# Patient Record
Sex: Female | Born: 1985 | Race: Black or African American | Hispanic: No | Marital: Single | Smoking: Current every day smoker
Health system: Southern US, Community
[De-identification: ages and names within clinical notes are randomized; demographics above are authoritative.]

## PROBLEM LIST (undated history)

## (undated) DIAGNOSIS — F909 Attention-deficit hyperactivity disorder, unspecified type: Secondary | ICD-10-CM

## (undated) DIAGNOSIS — I10 Essential (primary) hypertension: Secondary | ICD-10-CM

---

## 2014-01-07 DIAGNOSIS — F909 Attention-deficit hyperactivity disorder, unspecified type: Secondary | ICD-10-CM | POA: Insufficient documentation

## 2014-01-07 DIAGNOSIS — Z720 Tobacco use: Secondary | ICD-10-CM | POA: Insufficient documentation

## 2015-03-25 DIAGNOSIS — I8390 Asymptomatic varicose veins of unspecified lower extremity: Secondary | ICD-10-CM | POA: Insufficient documentation

## 2015-09-29 DIAGNOSIS — Z0000 Encounter for general adult medical examination without abnormal findings: Secondary | ICD-10-CM | POA: Insufficient documentation

## 2015-09-29 DIAGNOSIS — F3341 Major depressive disorder, recurrent, in partial remission: Secondary | ICD-10-CM | POA: Insufficient documentation

## 2016-04-09 DIAGNOSIS — R21 Rash and other nonspecific skin eruption: Secondary | ICD-10-CM | POA: Insufficient documentation

## 2016-07-05 DIAGNOSIS — M79632 Pain in left forearm: Secondary | ICD-10-CM | POA: Insufficient documentation

## 2016-07-05 DIAGNOSIS — M25532 Pain in left wrist: Secondary | ICD-10-CM | POA: Insufficient documentation

## 2016-07-05 DIAGNOSIS — T7491XA Unspecified adult maltreatment, confirmed, initial encounter: Secondary | ICD-10-CM | POA: Insufficient documentation

## 2016-07-11 DIAGNOSIS — F1210 Cannabis abuse, uncomplicated: Secondary | ICD-10-CM | POA: Insufficient documentation

## 2016-07-12 DIAGNOSIS — F1410 Cocaine abuse, uncomplicated: Secondary | ICD-10-CM | POA: Insufficient documentation

## 2017-04-12 ENCOUNTER — Emergency Department
Admission: EM | Admit: 2017-04-12 | Discharge: 2017-04-12 | Disposition: A | Discharge status: Home / Self Care | Payer: Medicaid Other | Attending: Emergency Medicine | Admitting: Emergency Medicine

## 2017-04-12 ENCOUNTER — Encounter: Payer: Self-pay | Admitting: Emergency Medicine

## 2017-04-12 ENCOUNTER — Emergency Department: Payer: Medicaid Other

## 2017-04-12 DIAGNOSIS — F17200 Nicotine dependence, unspecified, uncomplicated: Secondary | ICD-10-CM | POA: Insufficient documentation

## 2017-04-12 DIAGNOSIS — Y929 Unspecified place or not applicable: Secondary | ICD-10-CM | POA: Insufficient documentation

## 2017-04-12 DIAGNOSIS — R079 Chest pain, unspecified: Secondary | ICD-10-CM | POA: Insufficient documentation

## 2017-04-12 DIAGNOSIS — Y999 Unspecified external cause status: Secondary | ICD-10-CM | POA: Diagnosis not present

## 2017-04-12 DIAGNOSIS — Y939 Activity, unspecified: Secondary | ICD-10-CM | POA: Diagnosis not present

## 2017-04-12 DIAGNOSIS — R112 Nausea with vomiting, unspecified: Secondary | ICD-10-CM | POA: Diagnosis not present

## 2017-04-12 DIAGNOSIS — S299XXA Unspecified injury of thorax, initial encounter: Secondary | ICD-10-CM | POA: Diagnosis present

## 2017-04-12 LAB — BASIC METABOLIC PANEL
ANION GAP: 11 (ref 5–15)
BUN: 9 mg/dL (ref 6–20)
CALCIUM: 9.2 mg/dL (ref 8.9–10.3)
CO2: 25 mmol/L (ref 22–32)
Chloride: 102 mmol/L (ref 101–111)
Creatinine, Ser: 0.71 mg/dL (ref 0.44–1.00)
Glucose, Bld: 81 mg/dL (ref 65–99)
Potassium: 3.5 mmol/L (ref 3.5–5.1)
Sodium: 138 mmol/L (ref 135–145)

## 2017-04-12 LAB — CBC
HCT: 40.8 % (ref 35.0–47.0)
HEMOGLOBIN: 13.6 g/dL (ref 12.0–16.0)
MCH: 30.1 pg (ref 26.0–34.0)
MCHC: 33.4 g/dL (ref 32.0–36.0)
MCV: 90 fL (ref 80.0–100.0)
Platelets: 261 10*3/uL (ref 150–440)
RBC: 4.53 MIL/uL (ref 3.80–5.20)
RDW: 13.2 % (ref 11.5–14.5)
WBC: 8.5 10*3/uL (ref 3.6–11.0)

## 2017-04-12 LAB — TROPONIN I

## 2017-04-12 MED ORDER — IBUPROFEN 800 MG PO TABS
ORAL_TABLET | ORAL | Status: AC
  Administered 2017-04-12: 800 mg via ORAL
  Filled 2017-04-12: qty 1

## 2017-04-12 MED ORDER — ACETAMINOPHEN 500 MG PO TABS
1000.0000 mg | ORAL_TABLET | Freq: Once | ORAL | Status: AC
  Administered 2017-04-12: 1000 mg via ORAL

## 2017-04-12 MED ORDER — IBUPROFEN 800 MG PO TABS: 800.0000 mg | ORAL_TABLET | Freq: Three times a day (TID) | ORAL | 0 refills | Status: DC | PRN

## 2017-04-12 MED ORDER — ONDANSETRON 4 MG PO TBDP
ORAL_TABLET | ORAL | Status: AC
  Administered 2017-04-12: 4 mg via ORAL
  Filled 2017-04-12: qty 1

## 2017-04-12 MED ORDER — ACETAMINOPHEN 500 MG PO TABS
ORAL_TABLET | ORAL | Status: AC
  Administered 2017-04-12: 1000 mg via ORAL
  Filled 2017-04-12: qty 2

## 2017-04-12 MED ORDER — IBUPROFEN 800 MG PO TABS
800.0000 mg | ORAL_TABLET | Freq: Once | ORAL | Status: AC
Start: 2017-04-12 — End: 2017-04-12
  Administered 2017-04-12: 800 mg via ORAL

## 2017-04-12 MED ORDER — ONDANSETRON 4 MG PO TBDP
4.0000 mg | ORAL_TABLET | Freq: Once | ORAL | Status: AC
  Administered 2017-04-12: 4 mg via ORAL

## 2017-04-12 MED ORDER — TRAMADOL HCL 50 MG PO TABS: 50.0000 mg | ORAL_TABLET | Freq: Four times a day (QID) | ORAL | 0 refills | Status: AC | PRN
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
## 2017-06-24 ENCOUNTER — Emergency Department
Admission: EM | Admit: 2017-06-24 | Discharge: 2017-06-24 | Disposition: A | Discharge status: Home / Self Care | Payer: Medicaid Other | Attending: Emergency Medicine | Admitting: Emergency Medicine

## 2017-06-24 ENCOUNTER — Encounter: Payer: Self-pay | Admitting: Emergency Medicine

## 2017-06-24 DIAGNOSIS — R1110 Vomiting, unspecified: Secondary | ICD-10-CM | POA: Insufficient documentation

## 2017-06-24 DIAGNOSIS — F17200 Nicotine dependence, unspecified, uncomplicated: Secondary | ICD-10-CM | POA: Diagnosis not present

## 2017-06-24 DIAGNOSIS — J029 Acute pharyngitis, unspecified: Secondary | ICD-10-CM | POA: Diagnosis not present

## 2017-06-24 DIAGNOSIS — R070 Pain in throat: Secondary | ICD-10-CM | POA: Diagnosis present

## 2017-06-24 LAB — COMPREHENSIVE METABOLIC PANEL
ALBUMIN: 4.1 g/dL (ref 3.5–5.0)
ALT: 38 U/L (ref 14–54)
ANION GAP: 11 (ref 5–15)
AST: 64 U/L — ABNORMAL HIGH (ref 15–41)
Alkaline Phosphatase: 62 U/L (ref 38–126)
BILIRUBIN TOTAL: 1.4 mg/dL — AB (ref 0.3–1.2)
BUN: 7 mg/dL (ref 6–20)
CHLORIDE: 97 mmol/L — AB (ref 101–111)
CO2: 26 mmol/L (ref 22–32)
Calcium: 9 mg/dL (ref 8.9–10.3)
Creatinine, Ser: 0.77 mg/dL (ref 0.44–1.00)
GFR calc Af Amer: >60 mL/min (ref >60)
GFR calc non Af Amer: >60 mL/min (ref >60)
GLUCOSE: 108 mg/dL — AB (ref 65–99)
POTASSIUM: 3.3 mmol/L — AB (ref 3.5–5.1)
SODIUM: 134 mmol/L — AB (ref 135–145)
TOTAL PROTEIN: 7.6 g/dL (ref 6.5–8.1)

## 2017-06-24 LAB — URINALYSIS, COMPLETE (UACMP) WITH MICROSCOPIC
BACTERIA UA: NONE SEEN
BILIRUBIN URINE: NEGATIVE
Glucose, UA: NEGATIVE mg/dL
Ketones, ur: 20 mg/dL — AB
LEUKOCYTES UA: NEGATIVE
NITRITE: NEGATIVE
PROTEIN: 30 mg/dL — AB
Specific Gravity, Urine: 1.019 (ref 1.005–1.030)
pH: 6 (ref 5.0–8.0)

## 2017-06-24 LAB — LIPASE, BLOOD: LIPASE: 20 U/L (ref 11–51)

## 2017-06-24 LAB — CBC
HEMATOCRIT: 42.5 % (ref 35.0–47.0)
Hemoglobin: 14.1 g/dL (ref 12.0–16.0)
MCH: 29.7 pg (ref 26.0–34.0)
MCHC: 33.1 g/dL (ref 32.0–36.0)
MCV: 89.7 fL (ref 80.0–100.0)
Platelets: 227 10*3/uL (ref 150–440)
RBC: 4.74 MIL/uL (ref 3.80–5.20)
RDW: 12.7 % (ref 11.5–14.5)
WBC: 19.7 10*3/uL — ABNORMAL HIGH (ref 3.6–11.0)

## 2017-06-24 LAB — LACTIC ACID, PLASMA: Lactic Acid, Venous: 1.3 mmol/L (ref 0.5–1.9)

## 2017-06-24 LAB — POCT PREGNANCY, URINE: PREG TEST UR: NEGATIVE

## 2017-06-24 LAB — POCT RAPID STREP A: Streptococcus, Group A Screen (Direct): NEGATIVE

## 2017-06-24 MED ORDER — DEXAMETHASONE SODIUM PHOSPHATE 10 MG/ML IJ SOLN
10.0000 mg | Freq: Once | INTRAMUSCULAR | Status: AC
  Administered 2017-06-24: 10 mg via INTRAVENOUS
  Filled 2017-06-24: qty 1

## 2017-06-24 MED ORDER — KETOROLAC TROMETHAMINE 30 MG/ML IJ SOLN
30.0000 mg | Freq: Once | INTRAMUSCULAR | Status: AC
  Administered 2017-06-24: 30 mg via INTRAVENOUS

## 2017-06-24 MED ORDER — ONDANSETRON 4 MG PO TBDP
ORAL_TABLET | ORAL | Status: AC
  Filled 2017-06-24: qty 1

## 2017-06-24 MED ORDER — KETOROLAC TROMETHAMINE 30 MG/ML IJ SOLN
INTRAMUSCULAR | Status: AC
  Administered 2017-06-24: 30 mg via INTRAVENOUS
  Filled 2017-06-24: qty 1

## 2017-06-24 MED ORDER — ONDANSETRON 4 MG PO TBDP
4.0000 mg | ORAL_TABLET | Freq: Once | ORAL | Status: AC | PRN
  Administered 2017-06-24: 4 mg via ORAL

## 2017-06-24 MED ORDER — SODIUM CHLORIDE 0.9 % IV BOLUS (SEPSIS)
1000.0000 mL | Freq: Once | INTRAVENOUS | Status: AC
  Administered 2017-06-24: 1000 mL via INTRAVENOUS

## 2017-06-24 NOTE — Discharge Instructions (Signed)
Please seek medical attention for any high fevers, chest pain, shortness of breath, change in behavior, persistent vomiting, bloody stool or any other new or concerning symptoms.  

## 2017-06-24 NOTE — ED Notes (Signed)
Tech notified RN pt threw up in bathroom. Will give zofran
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
## 2017-06-24 NOTE — ED Notes (Signed)
EDP at bedside  

## 2017-06-27 LAB — CULTURE, GROUP A STREP (THRC)

## 2017-10-18 ENCOUNTER — Emergency Department: Payer: Medicaid Other

## 2017-10-18 ENCOUNTER — Emergency Department
Admission: EM | Admit: 2017-10-18 | Discharge: 2017-10-18 | Disposition: A | Discharge status: Home / Self Care | Payer: Medicaid Other | Attending: Emergency Medicine | Admitting: Emergency Medicine

## 2017-10-18 DIAGNOSIS — F17200 Nicotine dependence, unspecified, uncomplicated: Secondary | ICD-10-CM | POA: Diagnosis not present

## 2017-10-18 DIAGNOSIS — K529 Noninfective gastroenteritis and colitis, unspecified: Secondary | ICD-10-CM | POA: Diagnosis not present

## 2017-10-18 DIAGNOSIS — Z79899 Other long term (current) drug therapy: Secondary | ICD-10-CM | POA: Insufficient documentation

## 2017-10-18 DIAGNOSIS — R109 Unspecified abdominal pain: Secondary | ICD-10-CM | POA: Diagnosis present

## 2017-10-18 LAB — CBC
HCT: 40.4 % (ref 35.0–47.0)
Hemoglobin: 13.2 g/dL (ref 12.0–16.0)
MCH: 29.5 pg (ref 26.0–34.0)
MCHC: 32.7 g/dL (ref 32.0–36.0)
MCV: 90.3 fL (ref 80.0–100.0)
PLATELETS: 298 10*3/uL (ref 150–440)
RBC: 4.47 MIL/uL (ref 3.80–5.20)
RDW: 12.6 % (ref 11.5–14.5)
WBC: 24.8 10*3/uL — AB (ref 3.6–11.0)

## 2017-10-18 LAB — COMPREHENSIVE METABOLIC PANEL
ALK PHOS: 65 U/L (ref 38–126)
ALT: 15 U/L (ref 14–54)
AST: 22 U/L (ref 15–41)
Albumin: 3.8 g/dL (ref 3.5–5.0)
Anion gap: 10 (ref 5–15)
BUN: 9 mg/dL (ref 6–20)
CALCIUM: 9.3 mg/dL (ref 8.9–10.3)
CO2: 26 mmol/L (ref 22–32)
CREATININE: 0.7 mg/dL (ref 0.44–1.00)
Chloride: 98 mmol/L — ABNORMAL LOW (ref 101–111)
Glucose, Bld: 109 mg/dL — ABNORMAL HIGH (ref 65–99)
Potassium: 3.6 mmol/L (ref 3.5–5.1)
Sodium: 134 mmol/L — ABNORMAL LOW (ref 135–145)
Total Bilirubin: 0.6 mg/dL (ref 0.3–1.2)
Total Protein: 7.3 g/dL (ref 6.5–8.1)

## 2017-10-18 LAB — URINALYSIS, COMPLETE (UACMP) WITH MICROSCOPIC
Bacteria, UA: NONE SEEN
Bilirubin Urine: NEGATIVE
GLUCOSE, UA: NEGATIVE mg/dL
KETONES UR: 20 mg/dL — AB
Nitrite: NEGATIVE
PH: 5 (ref 5.0–8.0)
Protein, ur: 100 mg/dL — AB
Specific Gravity, Urine: 1.032 — ABNORMAL HIGH (ref 1.005–1.030)

## 2017-10-18 LAB — POCT PREGNANCY, URINE: Preg Test, Ur: NEGATIVE

## 2017-10-18 LAB — LACTIC ACID, PLASMA: Lactic Acid, Venous: 0.9 mmol/L (ref 0.5–1.9)

## 2017-10-18 LAB — LIPASE, BLOOD: Lipase: 18 U/L (ref 11–51)

## 2017-10-18 LAB — TROPONIN I: Troponin I: <0.03 ng/mL (ref <0.03)

## 2017-10-18 MED ORDER — MORPHINE SULFATE (PF) 4 MG/ML IV SOLN
INTRAVENOUS | Status: AC
  Administered 2017-10-18: 4 mg via INTRAVENOUS
  Filled 2017-10-18: qty 1

## 2017-10-18 MED ORDER — ONDANSETRON HCL 4 MG/2ML IJ SOLN
4.0000 mg | Freq: Once | INTRAMUSCULAR | Status: AC
  Administered 2017-10-18: 4 mg via INTRAVENOUS

## 2017-10-18 MED ORDER — ONDANSETRON HCL 4 MG/2ML IJ SOLN
INTRAMUSCULAR | Status: AC
  Administered 2017-10-18: 4 mg via INTRAVENOUS
  Filled 2017-10-18: qty 2

## 2017-10-18 MED ORDER — METRONIDAZOLE IN NACL 5-0.79 MG/ML-% IV SOLN
500.0000 mg | Freq: Once | INTRAVENOUS | Status: AC
  Administered 2017-10-18: 500 mg via INTRAVENOUS
  Filled 2017-10-18: qty 100

## 2017-10-18 MED ORDER — MORPHINE SULFATE (PF) 4 MG/ML IV SOLN
4.0000 mg | Freq: Once | INTRAVENOUS | Status: AC
  Administered 2017-10-18: 4 mg via INTRAVENOUS
  Filled 2017-10-18: qty 1

## 2017-10-18 MED ORDER — CEFTRIAXONE SODIUM IN DEXTROSE 20 MG/ML IV SOLN
1.0000 g | Freq: Once | INTRAVENOUS | Status: AC
  Administered 2017-10-18: 1 g via INTRAVENOUS
  Filled 2017-10-18: qty 50

## 2017-10-18 MED ORDER — CIPROFLOXACIN HCL 500 MG PO TABS: 500.0000 mg | ORAL_TABLET | Freq: Two times a day (BID) | ORAL | 0 refills | Status: AC

## 2017-10-18 MED ORDER — MORPHINE SULFATE (PF) 4 MG/ML IV SOLN
6.0000 mg | Freq: Once | INTRAVENOUS | Status: AC
  Administered 2017-10-18: 4 mg via INTRAVENOUS

## 2017-10-18 MED ORDER — SODIUM CHLORIDE 0.9 % IV BOLUS (SEPSIS)
1000.0000 mL | Freq: Once | INTRAVENOUS | Status: AC
  Administered 2017-10-18: 1000 mL via INTRAVENOUS

## 2017-10-18 MED ORDER — OXYCODONE-ACETAMINOPHEN 5-325 MG PO TABS: 1.0000 | ORAL_TABLET | Freq: Four times a day (QID) | ORAL | 0 refills | Status: DC | PRN

## 2017-10-18 MED ORDER — IOPAMIDOL (ISOVUE-300) INJECTION 61%
100.0000 mL | Freq: Once | INTRAVENOUS | Status: AC | PRN
Start: 2017-10-18 — End: 2017-10-18
  Administered 2017-10-18: 100 mL via INTRAVENOUS

## 2017-10-18 MED ORDER — KETOROLAC TROMETHAMINE 30 MG/ML IJ SOLN
15.0000 mg | Freq: Once | INTRAMUSCULAR | Status: AC
  Administered 2017-10-18: 15 mg via INTRAVENOUS
  Filled 2017-10-18: qty 1

## 2017-10-18 NOTE — ED Triage Notes (Signed)
Patient reports she donated plasma yesterday.

## 2017-10-18 NOTE — ED Notes (Signed)
Patient reports that her period started 9/28, lasted 2 weeks, stopped for 3 days, restarted and has been ongoing since.

## 2017-10-18 NOTE — ED Provider Notes (Signed)
[facility] Emergency Department Provider Note  ____________________________________________   First MD Initiated Contact with Patient 10/18/17 [phone]     (approximate)  I have reviewed the triage vital signs and the nursing notes.   HISTORY  Chief Complaint Abdominal Pain   HPI Gina Rich is a 31 y.o. female who self presents to the emergency department with roughly 48 hours of insidious onset gradually progressive severe abdominal pain associated with nausea.  She has no history of intra-abdominal surgeries.  She has subjective fever although no chills.  No dysuria frequency or hesitancy.  No back pain.  The pain is worse with movement and somewhat improved with rest.  History reviewed. No pertinent past medical history.  There are no active problems to display for this patient.   History reviewed. No pertinent surgical history.  Prior to Admission medications   Medication Sig Start Date End Date Taking? Authorizing Provider  ciprofloxacin (CIPRO) 500 MG tablet Take 1 tablet (500 mg total) by mouth 2 (two) times daily. 10/18/17 10/23/17  Merrily Brittle, MD  ibuprofen (ADVIL,MOTRIN) 800 MG tablet Take 1 tablet (800 mg total) by mouth every 8 (eight) hours as needed. 04/12/17   Emily Filbert, MD  oxyCODONE-acetaminophen (ROXICET) 5-325 MG tablet Take 1 tablet by mouth every 6 (six) hours as needed for severe pain. 10/18/17   Merrily Brittle, MD  traMADol (ULTRAM) 50 MG tablet Take 1 tablet (50 mg total) by mouth every 6 (six) hours as needed. 04/12/17 04/12/18  Emily Filbert, MD    Allergies Patient has no known allergies.  No family history on file.  Social History Social History  Substance Use Topics  . Smoking status: Current Every Day Smoker  . Smokeless tobacco: Never Used  . Alcohol use Yes     Comment: occ    Review of Systems Constitutional: Positive for fevers Eyes: No visual changes. ENT: No sore  throat. Cardiovascular: Denies chest pain. Respiratory: Denies shortness of breath. Gastrointestinal: Positive for abdominal pain.  Positive for nausea, no vomiting.  No diarrhea.  No constipation. Genitourinary: Negative for dysuria. Musculoskeletal: Negative for back pain. Skin: Negative for rash. Neurological: Negative for headaches, focal weakness or numbness.   ____________________________________________   PHYSICAL EXAM:  VITAL SIGNS: ED Triage Vitals  Enc Vitals Group     BP 10/18/17 0019 119/69     Pulse Rate 10/18/17 0019 84     Resp 10/18/17 0019 20     Temp 10/18/17 0019 98.7 F (37.1 C)     Temp Source 10/18/17 0019 Oral     SpO2 10/18/17 0019 99 %     Weight 10/18/17 0017 155 lb (70.3 kg)     Height 10/18/17 0017 5\' 1"  (1.549 m)     Head Circumference --      Peak Flow --      Pain Score 10/18/17 0016 10     Pain Loc --      Pain Edu? --      Excl. in GC? --     Constitutional: Alert and oriented x4 tearful and uncomfortable appearing no diaphoresis Eyes: PERRL EOMI. Head: Atraumatic. Nose: No congestion/rhinnorhea. Mouth/Throat: No trismus Neck: No stridor.   Cardiovascular: Normal rate, regular rhythm. Grossly normal heart sounds.  Good peripheral circulation. Respiratory: Normal respiratory effort.  No retractions. Lungs CTAB and moving good air Gastrointestinal: Distended abdomen exquisitely tender right lower quadrant over McBurney's point with rebound and guarding negative Rovsing's Musculoskeletal: No lower extremity edema  Neurologic:  Normal speech and language. No gross focal neurologic deficits are appreciated. Skin:  Skin is warm, dry and intact. No rash noted. Psychiatric: Mood and affect are normal. Speech and behavior are normal.    ____________________________________________   DIFFERENTIAL includes but not limited to  Appendicitis, diverticulitis, ectopic pregnancy, urinary tract infection,  pyelonephritis ____________________________________________   LABS (all labs ordered are listed, but only abnormal results are displayed)  Labs Reviewed  COMPREHENSIVE METABOLIC PANEL - Abnormal; Notable for the following:       Result Value   Sodium 134 (*)    Chloride 98 (*)    Glucose, Bld 109 (*)    All other components within normal limits  CBC - Abnormal; Notable for the following:    WBC 24.8 (*)    All other components within normal limits  URINALYSIS, COMPLETE (UACMP) WITH MICROSCOPIC - Abnormal; Notable for the following:    Color, Urine AMBER (*)    APPearance HAZY (*)    Specific Gravity, Urine 1.032 (*)    Hgb urine dipstick LARGE (*)    Ketones, ur 20 (*)    Protein, ur 100 (*)    Leukocytes, UA TRACE (*)    Squamous Epithelial / LPF 0-5 (*)    All other components within normal limits  CULTURE, BLOOD (ROUTINE X 2)  CULTURE, BLOOD (ROUTINE X 2)  LIPASE, BLOOD  TROPONIN I  LACTIC ACID, PLASMA  LACTIC ACID, PLASMA  POC URINE PREG, ED  POCT PREGNANCY, URINE    Blood work reviewed and interpreted by me shows extremely high white count concerning for acute bacterial infection.  Hematuria however the patient is currently menstruating __________________________________________  EKG   ____________________________________________  RADIOLOGY  CT abdomen and pelvis reviewed by me shows no evidence of appendicitis but does show enterocolitis ____________________________________________   PROCEDURES  Procedure(s) performed: no  Procedures  Critical Care performed: no  Observation: no ____________________________________________   INITIAL IMPRESSION / ASSESSMENT AND PLAN / ED COURSE  Pertinent labs & imaging results that were available during my care of the patient were reviewed by me and considered in my medical decision making (see chart for details).      ----------------------------------------- 5:24 AM on  10/18/2017 -----------------------------------------  The patient has an oral temperature of 99.6 degrees, although she feels considerably warmer.  She has 2 days of diffuse abdominal discomfort and now exquisite tenderness in her right lower quadrant along with rebound.  I have a high suspicion for appendicitis so while the CT scan is pending we will treat her with ceftriaxone and Flagyl.` ____________________________________________  ----------------------------------------- 7:11 AM on 10/18/2017 -----------------------------------------  The patient's pain is significantly improved and she feels reassured that she does not have appendicitis.  She is able to eat and drink.  On further questioning her father has Crohn's disease.  I had a lengthy discussion with the patient regarding the diagnostic uncertainty and that her colitis could either be infectious versus inflammatory.  I will prescribe her 5 days of ciprofloxacin as well as refer her to gastroenterology.  Strict return precautions have been given and the patient verbalizes understanding and agreement with the plan.  FINAL CLINICAL IMPRESSION(S) / ED DIAGNOSES  Final diagnoses:  Colitis      NEW MEDICATIONS STARTED DURING THIS VISIT:  New Prescriptions   CIPROFLOXACIN (CIPRO) 500 MG TABLET    Take 1 tablet (500 mg total) by mouth 2 (two) times daily.   OXYCODONE-ACETAMINOPHEN (ROXICET) 5-325 MG TABLET    Take 1  tablet by mouth every 6 (six) hours as needed for severe pain.     Note:  This document was prepared using Dragon voice recognition software and may include unintentional dictation errors.     Merrily Brittle, MD 10/18/17 [phone]

## 2017-10-18 NOTE — ED Notes (Signed)
Pt resting in bed, resp even and unlabored, denies any needs 

## 2017-10-18 NOTE — ED Notes (Signed)
ED Provider at bedside. 

## 2017-10-18 NOTE — Discharge Instructions (Signed)
Please take all of your antibiotics as prescribed, and make sure you make an appointment to follow-up with the GI doctor in 1 week for a recheck.  Return to the emergency department sooner for any new or worsening symptoms such as worsening pain, fevers, chills, or for any other issues whatsoever.  It was a pleasure to take care of you today, and thank you for coming to our emergency department.  If you have any questions or concerns before leaving please ask the nurse to grab me and I'm more than happy to go through your aftercare instructions again.  If you were prescribed any opioid pain medication today such as Norco, Vicodin, Percocet, morphine, hydrocodone, or oxycodone please make sure you do not drive when you are taking this medication as it can alter your ability to drive safely.  If you have any concerns once you are home that you are not improving or are in fact getting worse before you can make it to your follow-up appointment, please do not hesitate to call 911 and come back for further evaluation.  Merrily BrittleNeil Shaka Cardin, MD  Results for orders placed or performed during the hospital encounter of 10/18/17  Lipase, blood  Result Value Ref Range   Lipase 18 11 - 51 U/L  Comprehensive metabolic panel  Result Value Ref Range   Sodium 134 (L) 135 - 145 mmol/L   Potassium 3.6 3.5 - 5.1 mmol/L   Chloride 98 (L) 101 - 111 mmol/L   CO2 26 22 - 32 mmol/L   Glucose, Bld 109 (H) 65 - 99 mg/dL   BUN 9 6 - 20 mg/dL   Creatinine, Ser 1.610.70 0.44 - 1.00 mg/dL   Calcium 9.3 8.9 - 09.610.3 mg/dL   Total Protein 7.3 6.5 - 8.1 g/dL   Albumin 3.8 3.5 - 5.0 g/dL   AST 22 15 - 41 U/L   ALT 15 14 - 54 U/L   Alkaline Phosphatase 65 38 - 126 U/L   Total Bilirubin 0.6 0.3 - 1.2 mg/dL   GFR calc non Af Amer >60 >60 mL/min   GFR calc Af Amer >60 >60 mL/min   Anion gap 10 5 - 15  CBC  Result Value Ref Range   WBC 24.8 (H) 3.6 - 11.0 K/uL   RBC 4.47 3.80 - 5.20 MIL/uL   Hemoglobin 13.2 12.0 - 16.0 g/dL   HCT  04.540.4 40.935.0 - 81.147.0 %   MCV 90.3 80.0 - 100.0 fL   MCH 29.5 26.0 - 34.0 pg   MCHC 32.7 32.0 - 36.0 g/dL   RDW 91.412.6 78.211.5 - 95.614.5 %   Platelets 298 150 - 440 K/uL  Urinalysis, Complete w Microscopic  Result Value Ref Range   Color, Urine AMBER (A) YELLOW   APPearance HAZY (A) CLEAR   Specific Gravity, Urine 1.032 (H) 1.005 - 1.030   pH 5.0 5.0 - 8.0   Glucose, UA NEGATIVE NEGATIVE mg/dL   Hgb urine dipstick LARGE (A) NEGATIVE   Bilirubin Urine NEGATIVE NEGATIVE   Ketones, ur 20 (A) NEGATIVE mg/dL   Protein, ur 213100 (A) NEGATIVE mg/dL   Nitrite NEGATIVE NEGATIVE   Leukocytes, UA TRACE (A) NEGATIVE   RBC / HPF TOO NUMEROUS TO COUNT 0 - 5 RBC/hpf   WBC, UA 6-30 0 - 5 WBC/hpf   Bacteria, UA NONE SEEN NONE SEEN   Squamous Epithelial / LPF 0-5 (A) NONE SEEN   Mucus PRESENT   Troponin I  Result Value Ref Range   Troponin  I <0.03 <0.03 ng/mL  Lactic acid, plasma  Result Value Ref Range   Lactic Acid, Venous 0.9 0.5 - 1.9 mmol/L  Pregnancy, urine POC  Result Value Ref Range   Preg Test, Ur NEGATIVE NEGATIVE   Ct Abdomen Pelvis W Contrast  Result Date: 10/18/2017 CLINICAL DATA:  Abdominal pain and bloating. EXAM: CT ABDOMEN AND PELVIS WITH CONTRAST TECHNIQUE: Multidetector CT imaging of the abdomen and pelvis was performed using the standard protocol following bolus administration of intravenous contrast. CONTRAST:  ISOVUE-300 IOPAMIDOL (ISOVUE-300) INJECTION 61% COMPARISON:  None. FINDINGS: Lower chest: Linear atelectasis in the right greater than left lower lobe. Hepatobiliary: Scattered tiny subcentimeter hepatic hypodensities are too small to accurately characterize, likely small cysts or biliary hamartomas. Gallbladder physiologically distended, no calcified stone. No biliary dilatation. Pancreas: No ductal dilatation or inflammation. Spleen: Normal in size without focal abnormality. Adrenals/Urinary Tract: Normal adrenal glands. No hydronephrosis. Homogeneous renal enhancement. No  perinephric edema. Urinary bladder is near completely decompressed and not well assessed. Stomach/Bowel: Lack of enteric contrast limits bowel assessment. Stomach is decompressed. Proximal small bowel is decompressed. Pelvic small bowel loops are fluid-filled and prominent but not particularly dilated. Mild mucosal enhancement without definite wall thickening. Liquid stool unenhanced in the cecum, ascending, and transverse colon without colonic wall thickening. More distal colon is decompressed. The appendix is similar fluid-filled similar to the adjacent cecum, nondilated, no periappendiceal inflammation. Vascular/Lymphatic: Normal caliber abdominal aorta. No enlarged abdominal or pelvic lymph nodes. Dilatation of the left ovarian vein measuring 9 mm. Reproductive: Trace fluid in the endometrial canal may be physiologic. Prominent left periuterine and adnexal vascularity with dilatation of the ovarian vein measuring 10 mm. No adnexal mass. Right adnexal vascularity also prominent to a lesser degree. Other: Minimal free fluid in the pelvis. No upper abdominal ascites. No free air or intra-abdominal abscess. Musculoskeletal: There are no acute or suspicious osseous abnormalities. IMPRESSION: 1. Fluid-filled enhanced ileal bowel loops, ascending and transverse colon without bowel inflammation or wall thickening. Findings may reflect enterocolitis. Similar fluid in the appendix to adjacent cecum without periappendiceal inflammation or evidence appendicitis. 2. Prominent periuterine and adnexal vascularity, left greater than right with dilatation of the ovarian veins suggesting pelvic congestion syndrome. Electronically Signed   By: Rubye Oaks M.D.   On: 10/18/2017 06:48

## 2017-10-18 NOTE — ED Triage Notes (Signed)
Patient c/o generalized abdominal pain and bloating beginning last night.  Patient denies nausea/diarrhea.

## 2017-10-23 LAB — CULTURE, BLOOD (ROUTINE X 2)
CULTURE: NO GROWTH
Culture: NO GROWTH

## 2017-11-14 ENCOUNTER — Other Ambulatory Visit: Payer: Self-pay

## 2017-11-15 ENCOUNTER — Ambulatory Visit: Payer: Medicaid Other | Admitting: Gastroenterology

## 2018-08-13 ENCOUNTER — Emergency Department: Payer: Medicaid Other

## 2018-08-13 ENCOUNTER — Encounter: Payer: Self-pay | Admitting: Emergency Medicine

## 2018-08-13 ENCOUNTER — Emergency Department
Admission: EM | Admit: 2018-08-13 | Discharge: 2018-08-13 | Disposition: A | Discharge status: Home / Self Care | Payer: Medicaid Other | Attending: Emergency Medicine | Admitting: Emergency Medicine

## 2018-08-13 DIAGNOSIS — Z79899 Other long term (current) drug therapy: Secondary | ICD-10-CM | POA: Insufficient documentation

## 2018-08-13 DIAGNOSIS — Y999 Unspecified external cause status: Secondary | ICD-10-CM | POA: Diagnosis not present

## 2018-08-13 DIAGNOSIS — S00211A Abrasion of right eyelid and periocular area, initial encounter: Secondary | ICD-10-CM | POA: Diagnosis not present

## 2018-08-13 DIAGNOSIS — S0083XA Contusion of other part of head, initial encounter: Secondary | ICD-10-CM | POA: Diagnosis not present

## 2018-08-13 DIAGNOSIS — Y9389 Activity, other specified: Secondary | ICD-10-CM | POA: Diagnosis not present

## 2018-08-13 DIAGNOSIS — Y929 Unspecified place or not applicable: Secondary | ICD-10-CM | POA: Diagnosis not present

## 2018-08-13 DIAGNOSIS — F17200 Nicotine dependence, unspecified, uncomplicated: Secondary | ICD-10-CM | POA: Insufficient documentation

## 2018-08-13 DIAGNOSIS — T07XXXA Unspecified multiple injuries, initial encounter: Secondary | ICD-10-CM | POA: Insufficient documentation

## 2018-08-13 DIAGNOSIS — S0990XA Unspecified injury of head, initial encounter: Secondary | ICD-10-CM | POA: Diagnosis present

## 2018-08-13 MED ORDER — HYDROCODONE-ACETAMINOPHEN 5-325 MG PO TABS
1.0000 | ORAL_TABLET | Freq: Once | ORAL | Status: AC
  Administered 2018-08-13: 1 via ORAL
  Filled 2018-08-13: qty 1

## 2018-08-13 MED ORDER — BACITRACIN 500 UNIT/GM EX OINT
TOPICAL_OINTMENT | Freq: Once | CUTANEOUS | Status: AC
  Administered 2018-08-13: 1 via TOPICAL
  Filled 2018-08-13 (×2): qty 14

## 2018-08-13 MED ORDER — TRAMADOL HCL 50 MG PO TABS: 50.0000 mg | ORAL_TABLET | Freq: Two times a day (BID) | ORAL | 0 refills | Status: DC

## 2018-08-13 MED ORDER — NABUMETONE 750 MG PO TABS: 750.0000 mg | ORAL_TABLET | Freq: Two times a day (BID) | ORAL | 0 refills | Status: DC
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
## 2018-08-13 NOTE — Discharge Instructions (Addendum)
Your exam and CT scan do not reveal any bony injury to the face. Avoid using alcohol or hydrogen peroxide to the face. Use only soap & water to cleanse the wounds. Apply bacitracin ointment to the wound to promote healing. Apply ice compresses to reduce swelling. Take the prescription pain medicine as needed. Take the prescription Relafen for non-drowsy pain & inflammation relief, as directed. Follow-up with your provider for ongoing symptoms.

## 2018-08-13 NOTE — ED Notes (Signed)
Left eye-20/50 Right eye-20/20 Bilateral-20/20

## 2018-12-13 IMAGING — CT CT ABD-PELV W/ CM
2 of 4 series · 14 of 46 positions shown, 16 images · IV contrast (APPLIED)
Comparison: None.

CLINICAL DATA: Abdominal pain and bloating.

EXAM:
CT ABDOMEN AND PELVIS WITH CONTRAST
TECHNIQUE: Multidetector CT imaging of the abdomen and pelvis was performed
using the standard protocol following bolus administration of
intravenous contrast.
CONTRAST:  100mL TGUNGU-ACC IOPAMIDOL (TGUNGU-ACC) INJECTION 61%

[Series 2: routine abd/pel with · axial · 0.72mm/px · z∈[-1030,-655]mm · 11 of 89 slices shown, 13 images]
[im 7/89  soft-tissue]
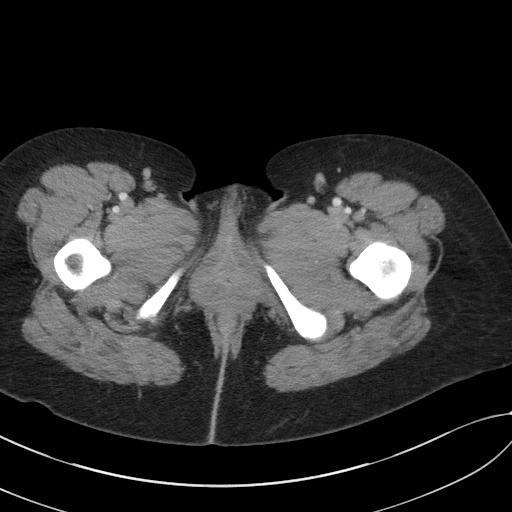
[im 7/89  bone]
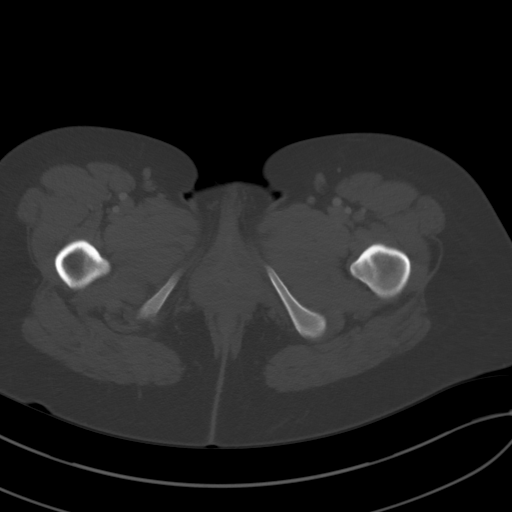
[im 14/89  soft-tissue]
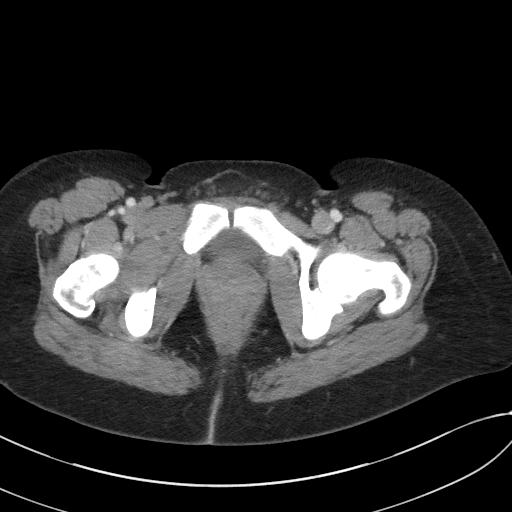
[im 21/89  soft-tissue]
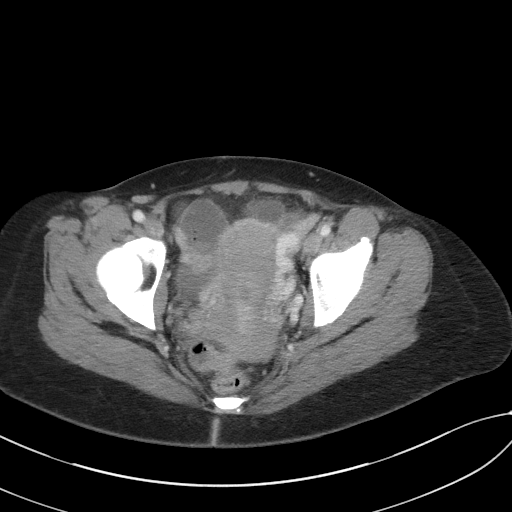
[im 28/89  soft-tissue]
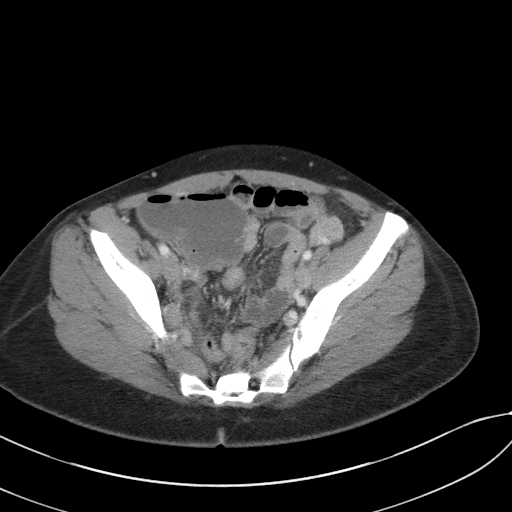
[im 38/89  soft-tissue]
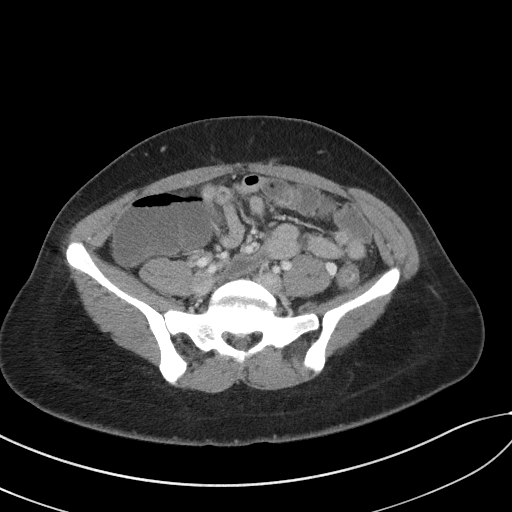
[im 45/89  soft-tissue]
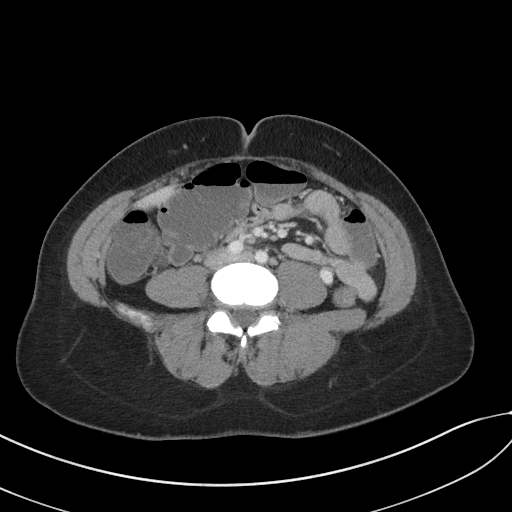
[im 51/89  soft-tissue]
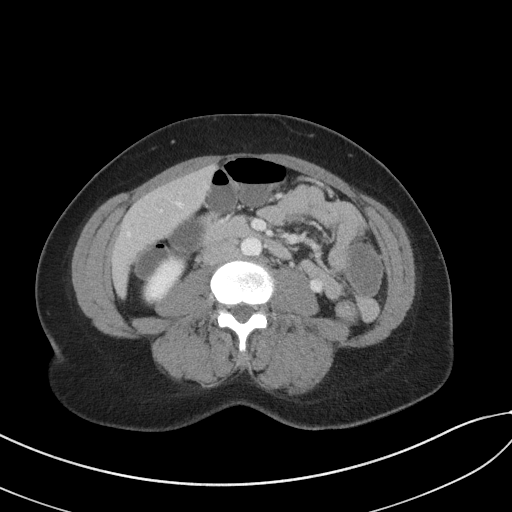
[im 61/89  soft-tissue]
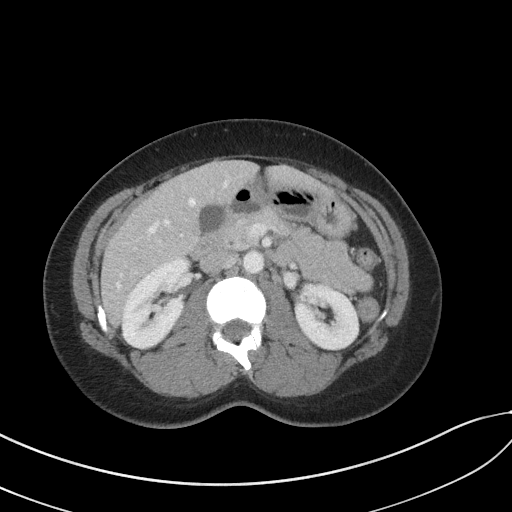
[im 68/89  soft-tissue]
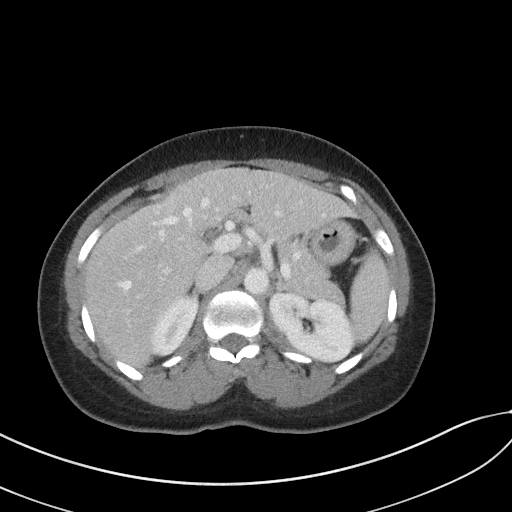
[im 68/89  bone]
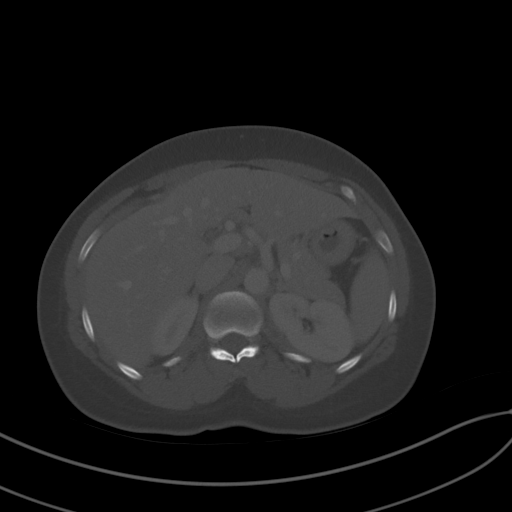
[im 75/89  soft-tissue]
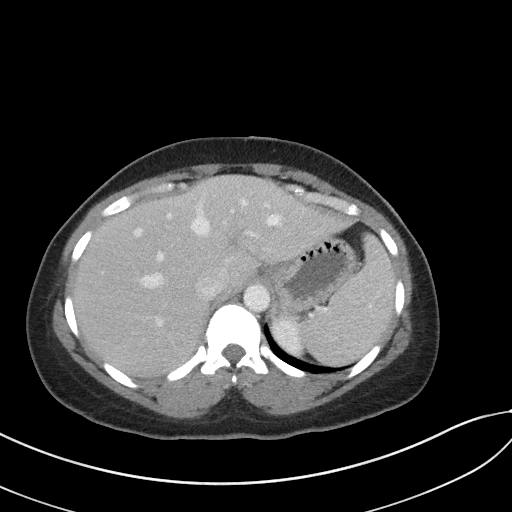
[im 82/89  soft-tissue]
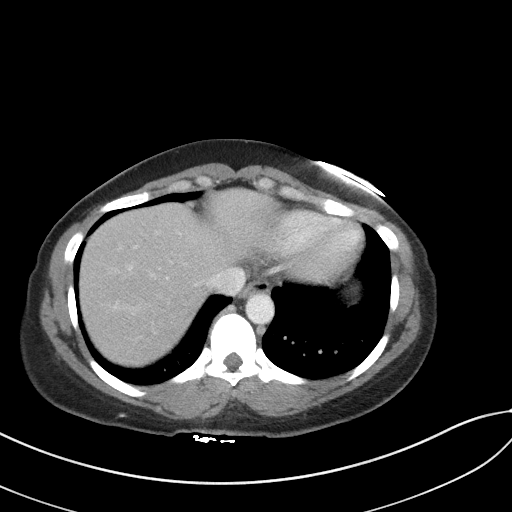

[Series 5: coronal st · coronal · 0.61mm/px · 3 of 65 slices shown]
[im 22/65  soft-tissue]
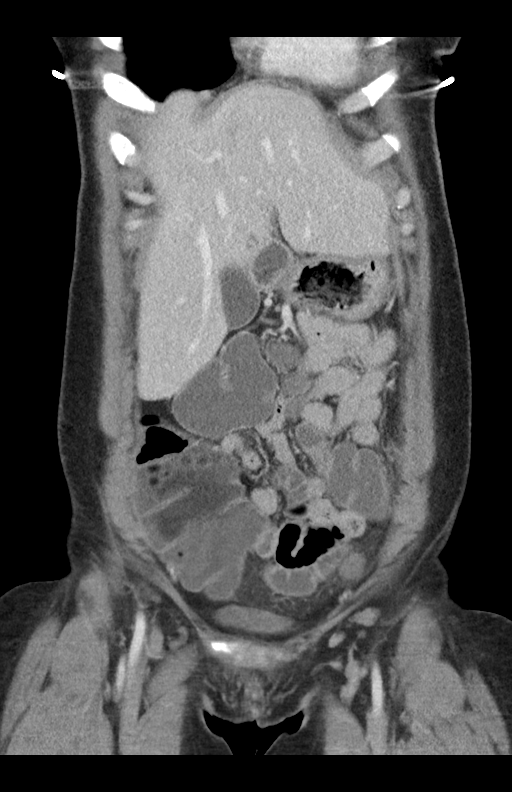
[im 29/65  soft-tissue]
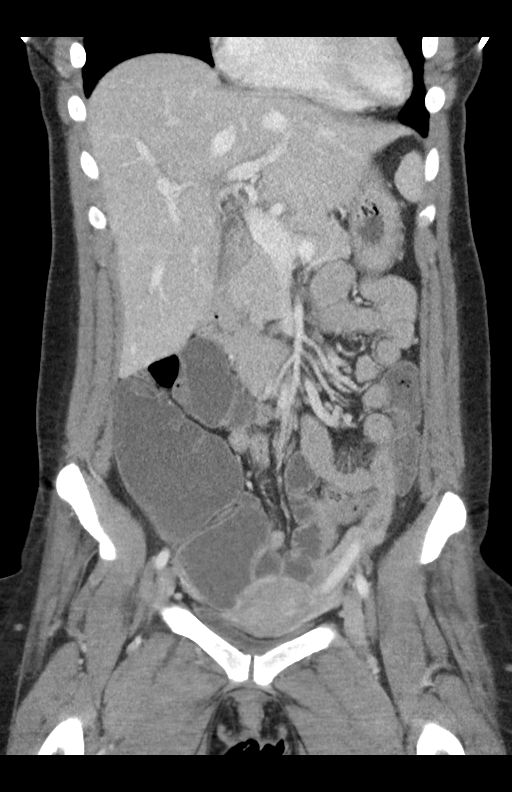
[im 36/65  soft-tissue]
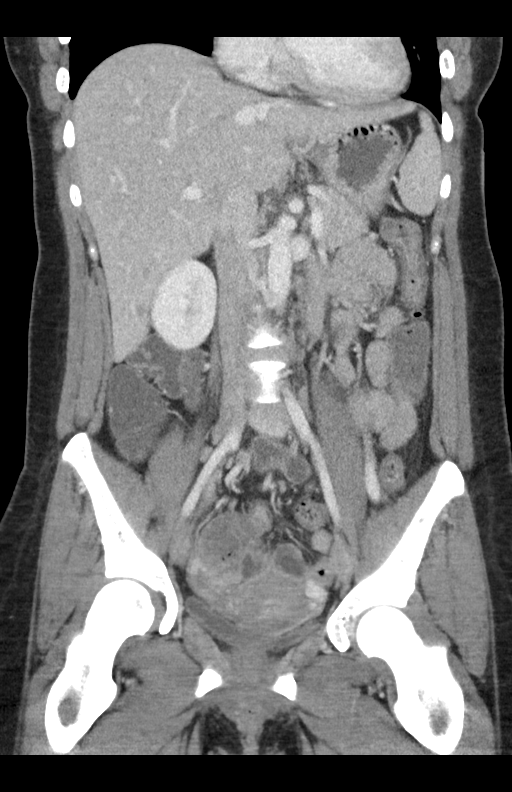

[14 of 46 positions shown; findings below may reference images not displayed]

FINDINGS: Lower chest: Linear atelectasis in the right greater than left lower
lobe.

Hepatobiliary: Scattered tiny subcentimeter hepatic hypodensities
are too small to accurately characterize, likely small cysts or
biliary hamartomas. Gallbladder physiologically distended, no
calcified stone. No biliary dilatation.

Pancreas: No ductal dilatation or inflammation.

Spleen: Normal in size without focal abnormality.

Adrenals/Urinary Tract: Normal adrenal glands. No hydronephrosis.
Homogeneous renal enhancement. No perinephric edema. Urinary bladder
is near completely decompressed and not well assessed.

Stomach/Bowel: Lack of enteric contrast limits bowel assessment.
Stomach is decompressed. Proximal small bowel is decompressed.
Pelvic small bowel loops are fluid-filled and prominent but not
particularly dilated. Mild mucosal enhancement without definite wall
thickening. Liquid stool unenhanced in the cecum, ascending, and
transverse colon without colonic wall thickening. More distal colon
is decompressed. The appendix is similar fluid-filled similar to the
adjacent cecum, nondilated, no periappendiceal inflammation.

Vascular/Lymphatic: Normal caliber abdominal aorta. No enlarged
abdominal or pelvic lymph nodes. Dilatation of the left ovarian vein
measuring 9 mm.

Reproductive: Trace fluid in the endometrial canal may be
physiologic. Prominent left periuterine and adnexal vascularity with
dilatation of the ovarian vein measuring 10 mm. No adnexal mass.
Right adnexal vascularity also prominent to a lesser degree.

Other: Minimal free fluid in the pelvis. No upper abdominal ascites.
No free air or intra-abdominal abscess.

Musculoskeletal: There are no acute or suspicious osseous
abnormalities.
IMPRESSION: 1. Fluid-filled enhanced ileal bowel loops, ascending and transverse
colon without bowel inflammation or wall thickening. Findings may
reflect enterocolitis. Similar fluid in the appendix to adjacent
cecum without periappendiceal inflammation or evidence appendicitis.
2. Prominent periuterine and adnexal vascularity, left greater than
right with dilatation of the ovarian veins suggesting pelvic
congestion syndrome.

## 2019-02-04 LAB — HM HEPATITIS C SCREENING LAB: HM Hepatitis Screen: NEGATIVE

## 2019-02-04 LAB — HM PAP SMEAR

## 2019-02-04 LAB — HM HIV SCREENING LAB: HM HIV Screening: NEGATIVE

## 2019-10-13 ENCOUNTER — Ambulatory Visit: Payer: Medicaid Other

## 2020-01-14 ENCOUNTER — Ambulatory Visit: Payer: Medicaid Other

## 2020-03-15 ENCOUNTER — Emergency Department
Admission: EM | Admit: 2020-03-15 | Discharge: 2020-03-16 | Disposition: A | Discharge status: Home / Self Care | Payer: Medicaid Other | Attending: Emergency Medicine | Admitting: Emergency Medicine

## 2020-03-15 ENCOUNTER — Emergency Department: Payer: Medicaid Other

## 2020-03-15 ENCOUNTER — Encounter: Payer: Self-pay | Admitting: Emergency Medicine

## 2020-03-15 ENCOUNTER — Other Ambulatory Visit: Payer: Self-pay

## 2020-03-15 DIAGNOSIS — J029 Acute pharyngitis, unspecified: Secondary | ICD-10-CM | POA: Diagnosis present

## 2020-03-15 DIAGNOSIS — R1011 Right upper quadrant pain: Secondary | ICD-10-CM

## 2020-03-15 DIAGNOSIS — Z79899 Other long term (current) drug therapy: Secondary | ICD-10-CM | POA: Insufficient documentation

## 2020-03-15 DIAGNOSIS — J020 Streptococcal pharyngitis: Secondary | ICD-10-CM | POA: Diagnosis not present

## 2020-03-15 LAB — BASIC METABOLIC PANEL
Anion gap: 12 (ref 5–15)
BUN: 9 mg/dL (ref 6–20)
CO2: 21 mmol/L — ABNORMAL LOW (ref 22–32)
Calcium: 9.2 mg/dL (ref 8.9–10.3)
Chloride: 99 mmol/L (ref 98–111)
Creatinine, Ser: 0.75 mg/dL (ref 0.44–1.00)
GFR calc Af Amer: >60 mL/min (ref >60)
GFR calc non Af Amer: >60 mL/min (ref >60)
Glucose, Bld: 141 mg/dL — ABNORMAL HIGH (ref 70–99)
Potassium: 3.8 mmol/L (ref 3.5–5.1)
Sodium: 132 mmol/L — ABNORMAL LOW (ref 135–145)

## 2020-03-15 LAB — CBC
HCT: 42.6 % (ref 36.0–46.0)
Hemoglobin: 14 g/dL (ref 12.0–15.0)
MCH: 29.9 pg (ref 26.0–34.0)
MCHC: 32.9 g/dL (ref 30.0–36.0)
MCV: 90.8 fL (ref 80.0–100.0)
Platelets: 402 10*3/uL — ABNORMAL HIGH (ref 150–400)
RBC: 4.69 MIL/uL (ref 3.87–5.11)
RDW: 13.2 % (ref 11.5–15.5)
WBC: 13.7 10*3/uL — ABNORMAL HIGH (ref 4.0–10.5)
nRBC: 0 % (ref 0.0–0.2)

## 2020-03-15 LAB — URINALYSIS, COMPLETE (UACMP) WITH MICROSCOPIC
Bacteria, UA: NONE SEEN
Bilirubin Urine: NEGATIVE
Glucose, UA: NEGATIVE mg/dL
Hgb urine dipstick: NEGATIVE
Ketones, ur: 80 mg/dL — AB
Nitrite: NEGATIVE
Protein, ur: 100 mg/dL — AB
Specific Gravity, Urine: 1.023 (ref 1.005–1.030)
pH: 6 (ref 5.0–8.0)

## 2020-03-15 LAB — COMPREHENSIVE METABOLIC PANEL
ALT: 49 U/L — ABNORMAL HIGH (ref 0–44)
AST: 78 U/L — ABNORMAL HIGH (ref 15–41)
Albumin: 4.7 g/dL (ref 3.5–5.0)
Alkaline Phosphatase: 95 U/L (ref 38–126)
Anion gap: 21 — ABNORMAL HIGH (ref 5–15)
BUN: 9 mg/dL (ref 6–20)
CO2: 14 mmol/L — ABNORMAL LOW (ref 22–32)
Calcium: 9.7 mg/dL (ref 8.9–10.3)
Chloride: 98 mmol/L (ref 98–111)
Creatinine, Ser: 0.97 mg/dL (ref 0.44–1.00)
GFR calc Af Amer: >60 mL/min (ref >60)
GFR calc non Af Amer: >60 mL/min (ref >60)
Glucose, Bld: 77 mg/dL (ref 70–99)
Potassium: 4.6 mmol/L (ref 3.5–5.1)
Sodium: 133 mmol/L — ABNORMAL LOW (ref 135–145)
Total Bilirubin: 2.2 mg/dL — ABNORMAL HIGH (ref 0.3–1.2)
Total Protein: 9.5 g/dL — ABNORMAL HIGH (ref 6.5–8.1)

## 2020-03-15 LAB — PREGNANCY, URINE: Preg Test, Ur: NEGATIVE

## 2020-03-15 LAB — LIPASE, BLOOD: Lipase: 20 U/L (ref 11–51)

## 2020-03-15 LAB — GROUP A STREP BY PCR: Group A Strep by PCR: DETECTED — AB

## 2020-03-15 MED ORDER — METOCLOPRAMIDE HCL 5 MG/ML IJ SOLN
10.0000 mg | Freq: Once | INTRAMUSCULAR | Status: AC
  Administered 2020-03-15: 10 mg via INTRAVENOUS
  Filled 2020-03-15: qty 2

## 2020-03-15 MED ORDER — DEXAMETHASONE 4 MG PO TABS
10.0000 mg | ORAL_TABLET | Freq: Once | ORAL | Status: DC
  Filled 2020-03-15: qty 2.5

## 2020-03-15 MED ORDER — ONDANSETRON HCL 4 MG/2ML IJ SOLN
4.0000 mg | Freq: Once | INTRAMUSCULAR | Status: AC
  Administered 2020-03-15: 4 mg via INTRAVENOUS
  Filled 2020-03-15: qty 2

## 2020-03-15 MED ORDER — PENICILLIN V POTASSIUM 500 MG PO TABS: 500.0000 mg | ORAL_TABLET | Freq: Two times a day (BID) | ORAL | 0 refills | Status: AC

## 2020-03-15 MED ORDER — DEXAMETHASONE SODIUM PHOSPHATE 10 MG/ML IJ SOLN
10.0000 mg | Freq: Once | INTRAMUSCULAR | Status: DC
  Filled 2020-03-15: qty 1

## 2020-03-15 MED ORDER — DIPHENHYDRAMINE HCL 50 MG/ML IJ SOLN
12.5000 mg | Freq: Once | INTRAMUSCULAR | Status: AC
  Administered 2020-03-15: 12.5 mg via INTRAVENOUS
  Filled 2020-03-15: qty 1

## 2020-03-15 MED ORDER — LACTATED RINGERS IV BOLUS
1000.0000 mL | Freq: Once | INTRAVENOUS | Status: AC
  Administered 2020-03-15: 1000 mL via INTRAVENOUS

## 2020-03-15 MED ORDER — PENICILLIN V POTASSIUM 250 MG PO TABS
500.0000 mg | ORAL_TABLET | Freq: Once | ORAL | Status: AC
  Administered 2020-03-15: 500 mg via ORAL
  Filled 2020-03-15: qty 2

## 2020-03-15 MED ORDER — ACETAMINOPHEN 500 MG PO TABS
1000.0000 mg | ORAL_TABLET | Freq: Once | ORAL | Status: AC
  Administered 2020-03-15: 1000 mg via ORAL
  Filled 2020-03-15: qty 2

## 2020-03-15 MED ORDER — DEXAMETHASONE SODIUM PHOSPHATE 10 MG/ML IJ SOLN
10.0000 mg | Freq: Once | INTRAMUSCULAR | Status: AC
  Administered 2020-03-15: 10 mg via INTRAVENOUS

## 2020-03-15 MED ORDER — FLUCONAZOLE 150 MG PO TABS: 150.0000 mg | ORAL_TABLET | Freq: Every day | ORAL | 0 refills | Status: AC

## 2020-03-15 MED ORDER — ONDANSETRON 4 MG PO TBDP: 4.0000 mg | ORAL_TABLET | Freq: Three times a day (TID) | ORAL | 0 refills | Status: DC | PRN

## 2020-03-15 MED ORDER — KETOROLAC TROMETHAMINE 30 MG/ML IJ SOLN
30.0000 mg | Freq: Once | INTRAMUSCULAR | Status: AC
  Administered 2020-03-15: 30 mg via INTRAVENOUS
  Filled 2020-03-15: qty 1
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
## 2020-03-15 NOTE — ED Triage Notes (Addendum)
Reports dx with mono 2 weeks ago. Was feeling better and then yesterday started with vomiting, headache and sore throat last night.  Took tylenol 3 hours ago but thinks threw it up.  Has not been able to keep anything down.  Headache to frontal area. No active vomiting.

## 2020-03-15 NOTE — Discharge Instructions (Addendum)
Take the antibiotics to treat your strep throat.  The Zofran to help with nausea.  Take Tylenol 1 g every 8 hours and ibuprofen 600 every 6 hours with crackers or something lay on her stomach.  Use the Diflucan after you complete the course of antibiotics

## 2020-03-15 NOTE — ED Notes (Signed)
Pt given [place] sandwich tray and graham crackers at this time.

## 2020-03-15 NOTE — ED Notes (Signed)
ED Provider at bedside. 

## 2020-07-04 ENCOUNTER — Other Ambulatory Visit: Payer: Self-pay

## 2020-07-04 ENCOUNTER — Ambulatory Visit (LOCAL_COMMUNITY_HEALTH_CENTER): Payer: Medicaid Other | Admitting: Physician Assistant

## 2020-07-04 ENCOUNTER — Other Ambulatory Visit: Payer: Self-pay | Admitting: Family Medicine

## 2020-07-04 VITALS — BP 133/96 | Ht 61.0 in | Wt 150.4 lb

## 2020-07-04 DIAGNOSIS — Z30013 Encounter for initial prescription of injectable contraceptive: Secondary | ICD-10-CM | POA: Diagnosis not present

## 2020-07-04 DIAGNOSIS — N760 Acute vaginitis: Secondary | ICD-10-CM

## 2020-07-04 DIAGNOSIS — B9689 Other specified bacterial agents as the cause of diseases classified elsewhere: Secondary | ICD-10-CM

## 2020-07-04 DIAGNOSIS — Z3009 Encounter for other general counseling and advice on contraception: Secondary | ICD-10-CM

## 2020-07-04 DIAGNOSIS — Z113 Encounter for screening for infections with a predominantly sexual mode of transmission: Secondary | ICD-10-CM

## 2020-07-04 DIAGNOSIS — Z299 Encounter for prophylactic measures, unspecified: Secondary | ICD-10-CM

## 2020-07-04 LAB — WET PREP FOR TRICH, YEAST, CLUE
Trichomonas Exam: NEGATIVE
Yeast Exam: NEGATIVE

## 2020-07-04 LAB — PREGNANCY, URINE: Preg Test, Ur: NEGATIVE

## 2020-07-04 MED ORDER — MEDROXYPROGESTERONE ACETATE 150 MG/ML IM SUSP
150.0000 mg | Freq: Once | INTRAMUSCULAR | Status: AC
  Administered 2020-07-04: 150 mg via INTRAMUSCULAR

## 2020-07-04 MED ORDER — METRONIDAZOLE 500 MG PO TABS: 500.0000 mg | ORAL_TABLET | Freq: Two times a day (BID) | ORAL | 0 refills | Status: DC

## 2020-07-04 MED ORDER — FLUCONAZOLE 150 MG PO TABS: 150.0000 mg | ORAL_TABLET | Freq: Once | ORAL | 0 refills | Status: AC

## 2020-07-04 NOTE — Progress Notes (Signed)
UPT is negative today. Wet mount reviewed with provider and pt treated for BV per Sadie Haber, PA verbal order and standing order. Pt's BP 133/96 and Sadie Haber, PA made aware. Pt received Depo 150mg  IM per , PA verbal order and pt tolerated well. Pt also received COVID-19 vaccine by Sadie Haber, RN; Glenna Fellows, RN counseled pt about importance of staying 30 minutes to be monitored post vaccination, but pt left against medical advice as she states she has somewhere else she needs to be. Counseled pt per provider orders and pt states understanding. Provider orders completed.

## 2020-07-06 ENCOUNTER — Encounter: Payer: Self-pay | Admitting: Physician Assistant
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
## 2020-07-08 LAB — GONOCOCCUS CULTURE

## 2020-07-12 NOTE — Addendum Note (Signed)
Addended by: Heywood Bene on: 07/12/2020 02:31 PM   Modules accepted: Orders

## 2020-07-15 ENCOUNTER — Telehealth: Payer: Self-pay | Admitting: Family Medicine

## 2020-07-15 NOTE — Telephone Encounter (Signed)
Returned patient phone call to provided contact number. No answer, LMTC. Tawny Hopping, RN

## 2020-07-15 NOTE — Telephone Encounter (Signed)
Wants test results from last visit

## 2020-07-15 NOTE — Telephone Encounter (Signed)
Returned patient phone call a second time. Patient requesting all TR from 07/04/20 OV. Chart password verified. All TR given. Patient without further questions. Tawny Hopping, RN

## 2020-09-15 ENCOUNTER — Other Ambulatory Visit: Payer: Self-pay

## 2020-09-15 ENCOUNTER — Emergency Department
Admission: EM | Admit: 2020-09-15 | Discharge: 2020-09-15 | Disposition: A | Discharge status: Home / Self Care | Payer: Medicaid Other | Attending: Emergency Medicine | Admitting: Emergency Medicine

## 2020-09-15 DIAGNOSIS — F17200 Nicotine dependence, unspecified, uncomplicated: Secondary | ICD-10-CM | POA: Diagnosis not present

## 2020-09-15 DIAGNOSIS — R319 Hematuria, unspecified: Secondary | ICD-10-CM | POA: Insufficient documentation

## 2020-09-15 DIAGNOSIS — N739 Female pelvic inflammatory disease, unspecified: Secondary | ICD-10-CM

## 2020-09-15 DIAGNOSIS — R1030 Lower abdominal pain, unspecified: Secondary | ICD-10-CM | POA: Diagnosis present

## 2020-09-15 DIAGNOSIS — R300 Dysuria: Secondary | ICD-10-CM | POA: Insufficient documentation

## 2020-09-15 LAB — URINALYSIS, COMPLETE (UACMP) WITH MICROSCOPIC
Glucose, UA: NEGATIVE mg/dL
Hgb urine dipstick: NEGATIVE
Ketones, ur: 80 mg/dL — AB
Nitrite: NEGATIVE
Protein, ur: >=300 mg/dL — AB
Specific Gravity, Urine: 1.031 — ABNORMAL HIGH (ref 1.005–1.030)
pH: 5 (ref 5.0–8.0)

## 2020-09-15 LAB — POCT PREGNANCY, URINE: Preg Test, Ur: NEGATIVE

## 2020-09-15 LAB — CHLAMYDIA/NGC RT PCR (ARMC ONLY)
Chlamydia Tr: NOT DETECTED
N gonorrhoeae: NOT DETECTED

## 2020-09-15 LAB — LIPASE, BLOOD: Lipase: 25 U/L (ref 11–51)

## 2020-09-15 LAB — COMPREHENSIVE METABOLIC PANEL
ALT: 83 U/L — ABNORMAL HIGH (ref 0–44)
AST: 113 U/L — ABNORMAL HIGH (ref 15–41)
Albumin: 4.6 g/dL (ref 3.5–5.0)
Alkaline Phosphatase: 75 U/L (ref 38–126)
Anion gap: 19 — ABNORMAL HIGH (ref 5–15)
BUN: 10 mg/dL (ref 6–20)
CO2: 20 mmol/L — ABNORMAL LOW (ref 22–32)
Calcium: 9.3 mg/dL (ref 8.9–10.3)
Chloride: 96 mmol/L — ABNORMAL LOW (ref 98–111)
Creatinine, Ser: 0.62 mg/dL (ref 0.44–1.00)
GFR calc Af Amer: >60 mL/min (ref >60)
GFR calc non Af Amer: >60 mL/min (ref >60)
Glucose, Bld: 74 mg/dL (ref 70–99)
Potassium: 3.7 mmol/L (ref 3.5–5.1)
Sodium: 135 mmol/L (ref 135–145)
Total Bilirubin: 2.4 mg/dL — ABNORMAL HIGH (ref 0.3–1.2)
Total Protein: 8.1 g/dL (ref 6.5–8.1)

## 2020-09-15 LAB — WET PREP, GENITAL
Sperm: NONE SEEN
Yeast Wet Prep HPF POC: NONE SEEN

## 2020-09-15 LAB — CBC
HCT: 36.4 % (ref 36.0–46.0)
Hemoglobin: 12.6 g/dL (ref 12.0–15.0)
MCH: 30.5 pg (ref 26.0–34.0)
MCHC: 34.6 g/dL (ref 30.0–36.0)
MCV: 88.1 fL (ref 80.0–100.0)
Platelets: 321 10*3/uL (ref 150–400)
RBC: 4.13 MIL/uL (ref 3.87–5.11)
RDW: 13.5 % (ref 11.5–15.5)
WBC: 6.8 10*3/uL (ref 4.0–10.5)
nRBC: 0 % (ref 0.0–0.2)

## 2020-09-15 MED ORDER — IBUPROFEN 600 MG PO TABS: 600.0000 mg | ORAL_TABLET | Freq: Four times a day (QID) | ORAL | 0 refills | Status: DC | PRN

## 2020-09-15 MED ORDER — ONDANSETRON 4 MG PO TBDP
4.0000 mg | ORAL_TABLET | Freq: Once | ORAL | Status: AC | PRN
  Administered 2020-09-15: 4 mg via ORAL
  Filled 2020-09-15: qty 1

## 2020-09-15 MED ORDER — METRONIDAZOLE 250 MG PO TABS: 250.0000 mg | ORAL_TABLET | Freq: Two times a day (BID) | ORAL | 0 refills | Status: AC

## 2020-09-15 MED ORDER — TRAMADOL HCL 50 MG PO TABS
100.0000 mg | ORAL_TABLET | Freq: Once | ORAL | Status: AC
  Administered 2020-09-15: 100 mg via ORAL
  Filled 2020-09-15: qty 2

## 2020-09-15 MED ORDER — LIDOCAINE HCL (PF) 1 % IJ SOLN
INTRAMUSCULAR | Status: AC
  Administered 2020-09-15: 1 mL
  Filled 2020-09-15: qty 5

## 2020-09-15 MED ORDER — DOXYCYCLINE HYCLATE 100 MG PO TABS: 100.0000 mg | ORAL_TABLET | Freq: Two times a day (BID) | ORAL | 0 refills | Status: DC

## 2020-09-15 MED ORDER — CEFTRIAXONE SODIUM 250 MG IJ SOLR
250.0000 mg | Freq: Once | INTRAMUSCULAR | Status: AC
  Administered 2020-09-15: 250 mg via INTRAMUSCULAR
  Filled 2020-09-15: qty 250

## 2020-09-15 MED ORDER — DOXYCYCLINE HYCLATE 100 MG PO TABS
100.0000 mg | ORAL_TABLET | Freq: Once | ORAL | Status: AC
  Administered 2020-09-15: 100 mg via ORAL
  Filled 2020-09-15: qty 1

## 2020-09-15 NOTE — ED Provider Notes (Signed)
MSE was initiated and I personally evaluated the patient and placed orders (if any) at  9:27 PM on September 15, 2020.  The patient appears stable so that the remainder of the MSE may be completed by another provider.  Assumed patient care for Dr. Minna Antis.  Patient tested positive for both clue cells and 4 trichomoniasis.  We will treat patient with Flagyl twice daily for the next 10 days.  Return precautions were given.   Pia Mau [place], Cordelia Poche 09/15/20 2129    Minna Antis, MD 09/16/20 1934
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
## 2020-09-15 NOTE — ED Triage Notes (Addendum)
Pt arrives via pov, ambulatory to triage. Pt c/o vaginal pain starting, lower abd, and flank pain x  1 week ago. Pt reports blood in urine recent kidney infection 1 month ago and did not complete antibiotics. States ecoli in urine. Pt reports n/v. Denies diarrhea. States she was tested for sti's last week with negative results. NAD noted at this time.

## 2020-09-15 NOTE — ED Notes (Signed)
See triage note  Presents with dysuria and hematuria   States this started about 1 week ago  And has become worse   Also having some pain to groin area

## 2020-09-16 LAB — URINE CULTURE: Culture: NO GROWTH

## 2020-12-29 ENCOUNTER — Encounter: Payer: Self-pay | Admitting: Physician Assistant

## 2020-12-29 ENCOUNTER — Ambulatory Visit: Payer: Medicaid Other | Admitting: Physician Assistant

## 2020-12-29 ENCOUNTER — Other Ambulatory Visit: Payer: Self-pay

## 2020-12-29 DIAGNOSIS — Z113 Encounter for screening for infections with a predominantly sexual mode of transmission: Secondary | ICD-10-CM

## 2020-12-29 DIAGNOSIS — Z3200 Encounter for pregnancy test, result unknown: Secondary | ICD-10-CM

## 2020-12-29 LAB — PREGNANCY, URINE: Preg Test, Ur: NEGATIVE

## 2020-12-29 LAB — WET PREP FOR TRICH, YEAST, CLUE
Trichomonas Exam: NEGATIVE
Yeast Exam: NEGATIVE
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
## 2020-12-29 NOTE — Progress Notes (Signed)
Pt is here for STI screening. Pt reports LMP was ~08/16/2020.

## 2020-12-29 NOTE — Progress Notes (Signed)
UPT is negative today. Wet mount reviewed and is negative as well, so no treatment needed for wet mount per standing order. Awaiting TR's. Counseled pt per provider orders and pt states understanding. Provider orders completed.

## 2021-01-03 LAB — GONOCOCCUS CULTURE

## 2021-01-06 ENCOUNTER — Encounter: Payer: Self-pay | Admitting: Student

## 2021-01-06 LAB — HM HIV SCREENING LAB: HM HIV Screening: NEGATIVE

## 2021-01-21 ENCOUNTER — Emergency Department
Admission: EM | Admit: 2021-01-21 | Discharge: 2021-01-21 | Disposition: A | Discharge status: Home / Self Care | Payer: Medicaid Other | Attending: Emergency Medicine | Admitting: Emergency Medicine

## 2021-01-21 ENCOUNTER — Emergency Department: Payer: Medicaid Other

## 2021-01-21 ENCOUNTER — Other Ambulatory Visit: Payer: Self-pay

## 2021-01-21 DIAGNOSIS — F17200 Nicotine dependence, unspecified, uncomplicated: Secondary | ICD-10-CM | POA: Insufficient documentation

## 2021-01-21 DIAGNOSIS — E876 Hypokalemia: Secondary | ICD-10-CM | POA: Diagnosis not present

## 2021-01-21 DIAGNOSIS — R0789 Other chest pain: Secondary | ICD-10-CM | POA: Insufficient documentation

## 2021-01-21 DIAGNOSIS — M79645 Pain in left finger(s): Secondary | ICD-10-CM | POA: Diagnosis not present

## 2021-01-21 LAB — BASIC METABOLIC PANEL
Anion gap: 12 (ref 5–15)
BUN: 8 mg/dL (ref 6–20)
CO2: 23 mmol/L (ref 22–32)
Calcium: 8.7 mg/dL — ABNORMAL LOW (ref 8.9–10.3)
Chloride: 105 mmol/L (ref 98–111)
Creatinine, Ser: 0.59 mg/dL (ref 0.44–1.00)
GFR, Estimated: >60 mL/min (ref >60)
Glucose, Bld: 90 mg/dL (ref 70–99)
Potassium: 3.1 mmol/L — ABNORMAL LOW (ref 3.5–5.1)
Sodium: 140 mmol/L (ref 135–145)

## 2021-01-21 LAB — CBC
HCT: 35 % — ABNORMAL LOW (ref 36.0–46.0)
Hemoglobin: 11.4 g/dL — ABNORMAL LOW (ref 12.0–15.0)
MCH: 30.1 pg (ref 26.0–34.0)
MCHC: 32.6 g/dL (ref 30.0–36.0)
MCV: 92.3 fL (ref 80.0–100.0)
Platelets: 301 10*3/uL (ref 150–400)
RBC: 3.79 MIL/uL — ABNORMAL LOW (ref 3.87–5.11)
RDW: 13.7 % (ref 11.5–15.5)
WBC: 9 10*3/uL (ref 4.0–10.5)
nRBC: 0 % (ref 0.0–0.2)

## 2021-01-21 LAB — POC URINE PREG, ED: Preg Test, Ur: NEGATIVE

## 2021-01-21 LAB — TROPONIN I (HIGH SENSITIVITY)
Troponin I (High Sensitivity): <2 ng/L (ref <18)
Troponin I (High Sensitivity): <2 ng/L (ref <18)

## 2021-01-21 MED ORDER — OXYCODONE-ACETAMINOPHEN 5-325 MG PO TABS
1.0000 | ORAL_TABLET | Freq: Once | ORAL | Status: AC
  Administered 2021-01-21: 1 via ORAL
  Filled 2021-01-21: qty 1

## 2021-01-21 MED ORDER — IBUPROFEN 600 MG PO TABS
600.0000 mg | ORAL_TABLET | Freq: Once | ORAL | Status: AC
  Administered 2021-01-21: 600 mg via ORAL
  Filled 2021-01-21: qty 1

## 2021-01-21 MED ORDER — POTASSIUM CHLORIDE CRYS ER 20 MEQ PO TBCR
40.0000 meq | EXTENDED_RELEASE_TABLET | Freq: Once | ORAL | Status: AC
  Administered 2021-01-21: 40 meq via ORAL
  Filled 2021-01-21: qty 2
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
## 2021-01-21 NOTE — ED Notes (Signed)
Printed and pt signed for discharge

## 2021-01-21 NOTE — Discharge Instructions (Signed)

## 2021-02-14 ENCOUNTER — Ambulatory Visit: Payer: Medicaid Other

## 2021-07-01 ENCOUNTER — Emergency Department
Admission: EM | Admit: 2021-07-01 | Discharge: 2021-07-01 | Disposition: A | Discharge status: Home / Self Care | Payer: Medicaid Other | Attending: Emergency Medicine | Admitting: Emergency Medicine

## 2021-07-01 ENCOUNTER — Other Ambulatory Visit: Payer: Self-pay

## 2021-07-01 ENCOUNTER — Encounter: Payer: Self-pay | Admitting: Emergency Medicine

## 2021-07-01 ENCOUNTER — Emergency Department: Payer: Medicaid Other

## 2021-07-01 DIAGNOSIS — S0993XA Unspecified injury of face, initial encounter: Secondary | ICD-10-CM | POA: Diagnosis present

## 2021-07-01 DIAGNOSIS — T07XXXA Unspecified multiple injuries, initial encounter: Secondary | ICD-10-CM

## 2021-07-01 DIAGNOSIS — S5012XA Contusion of left forearm, initial encounter: Secondary | ICD-10-CM | POA: Diagnosis not present

## 2021-07-01 DIAGNOSIS — F17200 Nicotine dependence, unspecified, uncomplicated: Secondary | ICD-10-CM | POA: Diagnosis not present

## 2021-07-01 DIAGNOSIS — S0083XA Contusion of other part of head, initial encounter: Secondary | ICD-10-CM | POA: Diagnosis not present

## 2021-07-01 DIAGNOSIS — M542 Cervicalgia: Secondary | ICD-10-CM | POA: Insufficient documentation

## 2021-07-01 DIAGNOSIS — S5011XA Contusion of right forearm, initial encounter: Secondary | ICD-10-CM | POA: Diagnosis not present

## 2021-07-01 MED ORDER — OXYCODONE-ACETAMINOPHEN 5-325 MG PO TABS: 1.0000 | ORAL_TABLET | Freq: Once | ORAL | Status: DC

## 2021-07-01 MED ORDER — ORPHENADRINE CITRATE ER 100 MG PO TB12: 100.0000 mg | ORAL_TABLET | Freq: Two times a day (BID) | ORAL | 0 refills | Status: DC

## 2021-07-01 MED ORDER — NAPROXEN 500 MG PO TABS: 500.0000 mg | ORAL_TABLET | Freq: Two times a day (BID) | ORAL | 0 refills | Status: DC

## 2021-07-01 MED ORDER — CYCLOBENZAPRINE HCL 10 MG PO TABS: 10.0000 mg | ORAL_TABLET | Freq: Once | ORAL | Status: DC

## 2021-07-01 MED ORDER — IBUPROFEN 600 MG PO TABS: 600.0000 mg | ORAL_TABLET | Freq: Once | ORAL | Status: DC

## 2021-07-01 NOTE — Discharge Instructions (Addendum)
No acute findings on CT of the head, maxillofacial area, and cervical spine.  Read and follow discharge care instruction.  Take medication as directed.
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
## 2021-11-21 ENCOUNTER — Other Ambulatory Visit: Payer: Self-pay

## 2021-11-21 ENCOUNTER — Emergency Department: Payer: Medicaid Other

## 2021-11-21 ENCOUNTER — Emergency Department
Admission: EM | Admit: 2021-11-21 | Discharge: 2021-11-21 | Disposition: A | Discharge status: Home / Self Care | Payer: Medicaid Other | Attending: Emergency Medicine | Admitting: Emergency Medicine

## 2021-11-21 DIAGNOSIS — F10130 Alcohol abuse with withdrawal, uncomplicated: Secondary | ICD-10-CM | POA: Insufficient documentation

## 2021-11-21 DIAGNOSIS — F17200 Nicotine dependence, unspecified, uncomplicated: Secondary | ICD-10-CM | POA: Diagnosis not present

## 2021-11-21 DIAGNOSIS — R569 Unspecified convulsions: Secondary | ICD-10-CM | POA: Diagnosis present

## 2021-11-21 DIAGNOSIS — Y990 Civilian activity done for income or pay: Secondary | ICD-10-CM | POA: Insufficient documentation

## 2021-11-21 DIAGNOSIS — Z79899 Other long term (current) drug therapy: Secondary | ICD-10-CM | POA: Diagnosis not present

## 2021-11-21 DIAGNOSIS — F10939 Alcohol use, unspecified with withdrawal, unspecified: Secondary | ICD-10-CM

## 2021-11-21 LAB — CBC
HCT: 37.7 % (ref 36.0–46.0)
Hemoglobin: 12.1 g/dL (ref 12.0–15.0)
MCH: 31.4 pg (ref 26.0–34.0)
MCHC: 32.1 g/dL (ref 30.0–36.0)
MCV: 97.9 fL (ref 80.0–100.0)
Platelets: 273 10*3/uL (ref 150–400)
RBC: 3.85 MIL/uL — ABNORMAL LOW (ref 3.87–5.11)
RDW: 12.9 % (ref 11.5–15.5)
WBC: 5.1 10*3/uL (ref 4.0–10.5)
nRBC: 0 % (ref 0.0–0.2)

## 2021-11-21 LAB — BASIC METABOLIC PANEL
Anion gap: 14 (ref 5–15)
BUN: 5 mg/dL — ABNORMAL LOW (ref 6–20)
CO2: 28 mmol/L (ref 22–32)
Calcium: 9.2 mg/dL (ref 8.9–10.3)
Chloride: 95 mmol/L — ABNORMAL LOW (ref 98–111)
Creatinine, Ser: 0.67 mg/dL (ref 0.44–1.00)
GFR, Estimated: >60 mL/min (ref >60)
Glucose, Bld: 93 mg/dL (ref 70–99)
Potassium: 3.1 mmol/L — ABNORMAL LOW (ref 3.5–5.1)
Sodium: 137 mmol/L (ref 135–145)

## 2021-11-21 LAB — POC URINE PREG, ED: Preg Test, Ur: NEGATIVE

## 2021-11-21 MED ORDER — SODIUM CHLORIDE 0.9 % IV BOLUS
1000.0000 mL | Freq: Once | INTRAVENOUS | Status: AC
  Administered 2021-11-21: 1000 mL via INTRAVENOUS

## 2021-11-21 MED ORDER — LORAZEPAM 2 MG/ML IJ SOLN
1.0000 mg | Freq: Once | INTRAMUSCULAR | Status: AC
  Administered 2021-11-21: 1 mg via INTRAVENOUS
  Filled 2021-11-21: qty 1

## 2021-11-21 MED ORDER — FOLIC ACID 1 MG PO TABS
1.0000 mg | ORAL_TABLET | Freq: Once | ORAL | Status: AC
  Administered 2021-11-21: 1 mg via ORAL
  Filled 2021-11-21: qty 1

## 2021-11-21 MED ORDER — SODIUM CHLORIDE 0.9 % IV SOLN
1.0000 mg | Freq: Once | INTRAVENOUS | Status: DC
  Filled 2021-11-21: qty 0.2

## 2021-11-21 MED ORDER — CHLORDIAZEPOXIDE HCL 10 MG PO CAPS: ORAL_CAPSULE | ORAL | 0 refills | Status: DC

## 2021-11-21 MED ORDER — THIAMINE HCL 100 MG/ML IJ SOLN
100.0000 mg | Freq: Once | INTRAMUSCULAR | Status: AC
  Administered 2021-11-21: 100 mg via INTRAVENOUS
  Filled 2021-11-21: qty 2
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
## 2021-11-21 NOTE — ED Triage Notes (Signed)
Pt comes via EMs from work with c/o possible seizure. EMS reports pt was at work and walking after telling friend she was hungry. Pt then apparently started shaking while standing and then stopped. Pt then fell hitting head on counter and fell to floor.   No obvious signs of trauma.  BP-162/112  Pt on phone at this time. No distress noted.

## 2022-10-15 ENCOUNTER — Inpatient Hospital Stay: Payer: Medicaid Other

## 2022-10-15 ENCOUNTER — Emergency Department: Payer: Medicaid Other

## 2022-10-15 ENCOUNTER — Inpatient Hospital Stay
Admission: EM | Admit: 2022-10-15 | Discharge: 2022-10-19 | DRG: 871 | Disposition: A | Discharge status: Home / Self Care | Payer: Medicaid Other | Attending: Internal Medicine | Admitting: Internal Medicine

## 2022-10-15 ENCOUNTER — Other Ambulatory Visit: Payer: Self-pay

## 2022-10-15 DIAGNOSIS — D649 Anemia, unspecified: Secondary | ICD-10-CM | POA: Diagnosis present

## 2022-10-15 DIAGNOSIS — R112 Nausea with vomiting, unspecified: Secondary | ICD-10-CM | POA: Diagnosis present

## 2022-10-15 DIAGNOSIS — Z8249 Family history of ischemic heart disease and other diseases of the circulatory system: Secondary | ICD-10-CM

## 2022-10-15 DIAGNOSIS — A419 Sepsis, unspecified organism: Secondary | ICD-10-CM

## 2022-10-15 DIAGNOSIS — K760 Fatty (change of) liver, not elsewhere classified: Secondary | ICD-10-CM | POA: Diagnosis present

## 2022-10-15 DIAGNOSIS — I10 Essential (primary) hypertension: Secondary | ICD-10-CM | POA: Diagnosis present

## 2022-10-15 DIAGNOSIS — E860 Dehydration: Secondary | ICD-10-CM | POA: Diagnosis present

## 2022-10-15 DIAGNOSIS — E876 Hypokalemia: Secondary | ICD-10-CM | POA: Diagnosis present

## 2022-10-15 DIAGNOSIS — Z832 Family history of diseases of the blood and blood-forming organs and certain disorders involving the immune mechanism: Secondary | ICD-10-CM

## 2022-10-15 DIAGNOSIS — Z8049 Family history of malignant neoplasm of other genital organs: Secondary | ICD-10-CM

## 2022-10-15 DIAGNOSIS — N390 Urinary tract infection, site not specified: Secondary | ICD-10-CM

## 2022-10-15 DIAGNOSIS — A4151 Sepsis due to Escherichia coli [E. coli]: Principal | ICD-10-CM | POA: Diagnosis present

## 2022-10-15 DIAGNOSIS — F1010 Alcohol abuse, uncomplicated: Secondary | ICD-10-CM | POA: Diagnosis present

## 2022-10-15 DIAGNOSIS — R6521 Severe sepsis with septic shock: Secondary | ICD-10-CM | POA: Diagnosis present

## 2022-10-15 DIAGNOSIS — K709 Alcoholic liver disease, unspecified: Secondary | ICD-10-CM | POA: Diagnosis not present

## 2022-10-15 DIAGNOSIS — Z803 Family history of malignant neoplasm of breast: Secondary | ICD-10-CM | POA: Diagnosis not present

## 2022-10-15 DIAGNOSIS — I700 Atherosclerosis of aorta: Secondary | ICD-10-CM | POA: Diagnosis present

## 2022-10-15 DIAGNOSIS — R7881 Bacteremia: Secondary | ICD-10-CM | POA: Diagnosis not present

## 2022-10-15 DIAGNOSIS — Z8041 Family history of malignant neoplasm of ovary: Secondary | ICD-10-CM | POA: Diagnosis not present

## 2022-10-15 DIAGNOSIS — J9601 Acute respiratory failure with hypoxia: Secondary | ICD-10-CM | POA: Diagnosis present

## 2022-10-15 DIAGNOSIS — F17200 Nicotine dependence, unspecified, uncomplicated: Secondary | ICD-10-CM | POA: Diagnosis present

## 2022-10-15 DIAGNOSIS — Z79899 Other long term (current) drug therapy: Secondary | ICD-10-CM

## 2022-10-15 DIAGNOSIS — Z833 Family history of diabetes mellitus: Secondary | ICD-10-CM

## 2022-10-15 DIAGNOSIS — Z20822 Contact with and (suspected) exposure to covid-19: Secondary | ICD-10-CM | POA: Diagnosis present

## 2022-10-15 DIAGNOSIS — N10 Acute tubulo-interstitial nephritis: Secondary | ICD-10-CM | POA: Diagnosis not present

## 2022-10-15 DIAGNOSIS — F909 Attention-deficit hyperactivity disorder, unspecified type: Secondary | ICD-10-CM | POA: Diagnosis present

## 2022-10-15 DIAGNOSIS — F1210 Cannabis abuse, uncomplicated: Secondary | ICD-10-CM | POA: Diagnosis present

## 2022-10-15 HISTORY — DX: Attention-deficit hyperactivity disorder, unspecified type: F90.9

## 2022-10-15 HISTORY — DX: Essential (primary) hypertension: I10

## 2022-10-15 LAB — BLOOD CULTURE ID PANEL (REFLEXED) - BCID2

## 2022-10-15 LAB — URINE DRUG SCREEN, QUALITATIVE (ARMC ONLY)
Amphetamines, Ur Screen: POSITIVE — AB
Barbiturates, Ur Screen: NOT DETECTED
Benzodiazepine, Ur Scrn: NOT DETECTED
Cannabinoid 50 Ng, Ur ~~LOC~~: POSITIVE — AB
Cocaine Metabolite,Ur ~~LOC~~: NOT DETECTED
MDMA (Ecstasy)Ur Screen: NOT DETECTED
Methadone Scn, Ur: NOT DETECTED
Opiate, Ur Screen: NOT DETECTED
Phencyclidine (PCP) Ur S: NOT DETECTED
Tricyclic, Ur Screen: NOT DETECTED

## 2022-10-15 LAB — URINALYSIS, ROUTINE W REFLEX MICROSCOPIC
Bilirubin Urine: NEGATIVE
Glucose, UA: NEGATIVE mg/dL
Ketones, ur: NEGATIVE mg/dL
Nitrite: POSITIVE — AB
Protein, ur: 100 mg/dL — AB
Specific Gravity, Urine: 1.019 (ref 1.005–1.030)
WBC, UA: >50 WBC/hpf — ABNORMAL HIGH (ref 0–5)
pH: 5 (ref 5.0–8.0)

## 2022-10-15 LAB — COMPREHENSIVE METABOLIC PANEL
ALT: 55 U/L — ABNORMAL HIGH (ref 0–44)
AST: 118 U/L — ABNORMAL HIGH (ref 15–41)
Albumin: 4 g/dL (ref 3.5–5.0)
Alkaline Phosphatase: 86 U/L (ref 38–126)
Anion gap: 15 (ref 5–15)
BUN: 8 mg/dL (ref 6–20)
CO2: 28 mmol/L (ref 22–32)
Calcium: 9 mg/dL (ref 8.9–10.3)
Chloride: 93 mmol/L — ABNORMAL LOW (ref 98–111)
Creatinine, Ser: 0.61 mg/dL (ref 0.44–1.00)
GFR, Estimated: >60 mL/min (ref >60)
Glucose, Bld: 119 mg/dL — ABNORMAL HIGH (ref 70–99)
Potassium: 2.2 mmol/L — CL (ref 3.5–5.1)
Sodium: 136 mmol/L (ref 135–145)
Total Bilirubin: 2 mg/dL — ABNORMAL HIGH (ref 0.3–1.2)
Total Protein: 7.7 g/dL (ref 6.5–8.1)

## 2022-10-15 LAB — CBC
HCT: 34.6 % — ABNORMAL LOW (ref 36.0–46.0)
Hemoglobin: 11.6 g/dL — ABNORMAL LOW (ref 12.0–15.0)
MCH: 30.4 pg (ref 26.0–34.0)
MCHC: 33.5 g/dL (ref 30.0–36.0)
MCV: 90.6 fL (ref 80.0–100.0)
Platelets: 211 10*3/uL (ref 150–400)
RBC: 3.82 MIL/uL — ABNORMAL LOW (ref 3.87–5.11)
RDW: 14.2 % (ref 11.5–15.5)
WBC: 19 10*3/uL — ABNORMAL HIGH (ref 4.0–10.5)
nRBC: 0.1 % (ref 0.0–0.2)

## 2022-10-15 LAB — MAGNESIUM: Magnesium: 1.2 mg/dL — ABNORMAL LOW (ref 1.7–2.4)

## 2022-10-15 LAB — LACTIC ACID, PLASMA
Lactic Acid, Venous: 1.8 mmol/L (ref 0.5–1.9)
Lactic Acid, Venous: 2.8 mmol/L (ref 0.5–1.9)
Lactic Acid, Venous: 4.1 mmol/L (ref 0.5–1.9)
Lactic Acid, Venous: 4.6 mmol/L (ref 0.5–1.9)

## 2022-10-15 LAB — PROTIME-INR
INR: 1.2 (ref 0.8–1.2)
Prothrombin Time: 15.3 seconds — ABNORMAL HIGH (ref 11.4–15.2)

## 2022-10-15 LAB — TROPONIN I (HIGH SENSITIVITY)
Troponin I (High Sensitivity): 9 ng/L (ref <18)
Troponin I (High Sensitivity): 7 ng/L (ref <18)

## 2022-10-15 LAB — SARS CORONAVIRUS 2 BY RT PCR: SARS Coronavirus 2 by RT PCR: NEGATIVE

## 2022-10-15 LAB — LIPASE, BLOOD: Lipase: 39 U/L (ref 11–51)

## 2022-10-15 LAB — RESP PANEL BY RT-PCR (FLU A&B, COVID) ARPGX2
Influenza A by PCR: NEGATIVE
Influenza B by PCR: NEGATIVE
SARS Coronavirus 2 by RT PCR: NEGATIVE

## 2022-10-15 LAB — PREGNANCY, URINE: Preg Test, Ur: NEGATIVE

## 2022-10-15 LAB — POC URINE PREG, ED: Preg Test, Ur: NEGATIVE

## 2022-10-15 LAB — HIV ANTIBODY (ROUTINE TESTING W REFLEX): HIV Screen 4th Generation wRfx: NONREACTIVE

## 2022-10-15 MED ORDER — ACETAMINOPHEN 325 MG PO TABS
650.0000 mg | ORAL_TABLET | Freq: Once | ORAL | Status: AC
  Administered 2022-10-15: 650 mg via ORAL
  Filled 2022-10-15: qty 2

## 2022-10-15 MED ORDER — SODIUM CHLORIDE 0.9 % IV SOLN
2.0000 g | INTRAVENOUS | Status: DC
  Administered 2022-10-15: 2 g via INTRAVENOUS
  Filled 2022-10-15: qty 20

## 2022-10-15 MED ORDER — ONDANSETRON 4 MG PO TBDP
4.0000 mg | ORAL_TABLET | Freq: Once | ORAL | Status: AC | PRN
  Administered 2022-10-15: 4 mg via ORAL
  Filled 2022-10-15: qty 1

## 2022-10-15 MED ORDER — THIAMINE HCL 100 MG/ML IJ SOLN: 100.0000 mg | Freq: Every day | INTRAMUSCULAR | Status: DC

## 2022-10-15 MED ORDER — VORTIOXETINE HBR 5 MG PO TABS: 5.0000 mg | ORAL_TABLET | Freq: Every day | ORAL | Status: DC

## 2022-10-15 MED ORDER — SODIUM CHLORIDE 0.9 % IV SOLN
1.0000 g | Freq: Three times a day (TID) | INTRAVENOUS | Status: DC
  Administered 2022-10-15: 1 g via INTRAVENOUS
  Filled 2022-10-15 (×2): qty 20

## 2022-10-15 MED ORDER — ENOXAPARIN SODIUM 40 MG/0.4ML IJ SOSY
40.0000 mg | PREFILLED_SYRINGE | INTRAMUSCULAR | Status: DC
  Administered 2022-10-15 – 2022-10-19 (×5): 40 mg via SUBCUTANEOUS
  Filled 2022-10-15 (×5): qty 0.4

## 2022-10-15 MED ORDER — LORAZEPAM 2 MG/ML IJ SOLN
1.0000 mg | Freq: Once | INTRAMUSCULAR | Status: AC
  Administered 2022-10-15: 1 mg via INTRAVENOUS
  Filled 2022-10-15: qty 1

## 2022-10-15 MED ORDER — LACTATED RINGERS IV BOLUS
1000.0000 mL | Freq: Once | INTRAVENOUS | Status: AC
  Administered 2022-10-15: 1000 mL via INTRAVENOUS

## 2022-10-15 MED ORDER — SODIUM CHLORIDE 0.9 % IV SOLN
2.0000 g | INTRAVENOUS | Status: DC
  Administered 2022-10-16 – 2022-10-19 (×4): 2 g via INTRAVENOUS
  Filled 2022-10-15: qty 2
  Filled 2022-10-15 (×2): qty 20
  Filled 2022-10-15: qty 2

## 2022-10-15 MED ORDER — LORAZEPAM 2 MG PO TABS
0.0000 mg | ORAL_TABLET | Freq: Four times a day (QID) | ORAL | Status: AC
  Administered 2022-10-16 (×4): 2 mg via ORAL
  Filled 2022-10-15: qty 2
  Filled 2022-10-15 (×4): qty 1

## 2022-10-15 MED ORDER — SERTRALINE HCL 50 MG PO TABS: 100.0000 mg | ORAL_TABLET | Freq: Every day | ORAL | Status: DC

## 2022-10-15 MED ORDER — IOHEXOL 300 MG/ML  SOLN
100.0000 mL | Freq: Once | INTRAMUSCULAR | Status: AC | PRN
  Administered 2022-10-15: 100 mL via INTRAVENOUS

## 2022-10-15 MED ORDER — ONDANSETRON HCL 4 MG/2ML IJ SOLN
4.0000 mg | Freq: Once | INTRAMUSCULAR | Status: AC
  Administered 2022-10-15: 4 mg via INTRAVENOUS
  Filled 2022-10-15: qty 2

## 2022-10-15 MED ORDER — MAGNESIUM SULFATE 2 GM/50ML IV SOLN: 2.0000 g | Freq: Once | INTRAVENOUS | Status: DC

## 2022-10-15 MED ORDER — POTASSIUM CHLORIDE 2 MEQ/ML IV SOLN
INTRAVENOUS | Status: DC
  Filled 2022-10-15 (×6): qty 1000

## 2022-10-15 MED ORDER — POTASSIUM CHLORIDE 10 MEQ/100ML IV SOLN
10.0000 meq | INTRAVENOUS | Status: AC
  Administered 2022-10-15 (×4): 10 meq via INTRAVENOUS
  Filled 2022-10-15 (×4): qty 100

## 2022-10-15 MED ORDER — ONDANSETRON HCL 4 MG PO TABS
4.0000 mg | ORAL_TABLET | Freq: Four times a day (QID) | ORAL | Status: DC | PRN
  Administered 2022-10-16: 4 mg via ORAL
  Filled 2022-10-15: qty 1

## 2022-10-15 MED ORDER — LACTATED RINGERS IV BOLUS (SEPSIS)
250.0000 mL | Freq: Once | INTRAVENOUS | Status: AC
  Administered 2022-10-15: 250 mL via INTRAVENOUS

## 2022-10-15 MED ORDER — PANTOPRAZOLE SODIUM 40 MG IV SOLR
40.0000 mg | INTRAVENOUS | Status: DC
  Administered 2022-10-15 – 2022-10-19 (×5): 40 mg via INTRAVENOUS
  Filled 2022-10-15 (×5): qty 10

## 2022-10-15 MED ORDER — ONDANSETRON HCL 4 MG/2ML IJ SOLN
4.0000 mg | Freq: Four times a day (QID) | INTRAMUSCULAR | Status: DC | PRN
  Administered 2022-10-16 – 2022-10-17 (×2): 4 mg via INTRAVENOUS
  Filled 2022-10-15 (×2): qty 2

## 2022-10-15 MED ORDER — MAGNESIUM SULFATE 2 GM/50ML IV SOLN
2.0000 g | Freq: Once | INTRAVENOUS | Status: AC
  Administered 2022-10-15: 2 g via INTRAVENOUS
  Filled 2022-10-15: qty 50

## 2022-10-15 MED ORDER — LISDEXAMFETAMINE DIMESYLATE 20 MG PO CAPS
20.0000 mg | ORAL_CAPSULE | Freq: Two times a day (BID) | ORAL | Status: DC
  Administered 2022-10-15 – 2022-10-17 (×2): 20 mg via ORAL
  Filled 2022-10-15 (×3): qty 1

## 2022-10-15 MED ORDER — LORAZEPAM 2 MG/ML IJ SOLN
1.0000 mg | INTRAMUSCULAR | Status: AC | PRN
  Administered 2022-10-17 (×3): 2 mg via INTRAVENOUS
  Filled 2022-10-15 (×3): qty 1

## 2022-10-15 MED ORDER — FOLIC ACID 1 MG PO TABS
1.0000 mg | ORAL_TABLET | Freq: Every day | ORAL | Status: DC
  Administered 2022-10-15 – 2022-10-19 (×5): 1 mg via ORAL
  Filled 2022-10-15 (×5): qty 1

## 2022-10-15 MED ORDER — LORAZEPAM 1 MG PO TABS
1.0000 mg | ORAL_TABLET | ORAL | Status: AC | PRN
  Administered 2022-10-16: 2 mg via ORAL
  Administered 2022-10-17: 1 mg via ORAL
  Filled 2022-10-15: qty 1
  Filled 2022-10-15: qty 2
  Filled 2022-10-15: qty 1

## 2022-10-15 MED ORDER — THIAMINE MONONITRATE 100 MG PO TABS
100.0000 mg | ORAL_TABLET | Freq: Every day | ORAL | Status: DC
  Administered 2022-10-15 – 2022-10-19 (×5): 100 mg via ORAL
  Filled 2022-10-15 (×5): qty 1

## 2022-10-15 MED ORDER — ACETAMINOPHEN 500 MG PO TABS
1000.0000 mg | ORAL_TABLET | Freq: Once | ORAL | Status: AC
  Administered 2022-10-15: 1000 mg via ORAL
  Filled 2022-10-15: qty 2

## 2022-10-15 MED ORDER — ACETAMINOPHEN 325 MG PO TABS
650.0000 mg | ORAL_TABLET | Freq: Four times a day (QID) | ORAL | Status: DC | PRN
  Administered 2022-10-15 – 2022-10-19 (×9): 650 mg via ORAL
  Filled 2022-10-15 (×9): qty 2

## 2022-10-15 MED ORDER — LACTATED RINGERS IV BOLUS (SEPSIS)
1000.0000 mL | Freq: Once | INTRAVENOUS | Status: AC
  Administered 2022-10-15: 1000 mL via INTRAVENOUS

## 2022-10-15 MED ORDER — ADULT MULTIVITAMIN W/MINERALS CH
1.0000 | ORAL_TABLET | Freq: Every day | ORAL | Status: DC
  Administered 2022-10-15 – 2022-10-19 (×5): 1 via ORAL
  Filled 2022-10-15 (×5): qty 1

## 2022-10-15 MED ORDER — METOCLOPRAMIDE HCL 5 MG/ML IJ SOLN
INTRAMUSCULAR | Status: AC
  Filled 2022-10-15: qty 2

## 2022-10-15 MED ORDER — LORAZEPAM 2 MG PO TABS
0.0000 mg | ORAL_TABLET | Freq: Two times a day (BID) | ORAL | Status: AC
  Administered 2022-10-18: 1 mg via ORAL
  Administered 2022-10-18: 2 mg via ORAL
  Filled 2022-10-15: qty 1

## 2022-10-15 MED ORDER — METOCLOPRAMIDE HCL 5 MG/ML IJ SOLN
10.0000 mg | Freq: Once | INTRAMUSCULAR | Status: AC
  Administered 2022-10-15: 10 mg via INTRAVENOUS
  Filled 2022-10-15: qty 2

## 2022-10-15 MED ORDER — KETOROLAC TROMETHAMINE 30 MG/ML IJ SOLN
15.0000 mg | Freq: Once | INTRAMUSCULAR | Status: AC
  Administered 2022-10-15: 15 mg via INTRAVENOUS
  Filled 2022-10-15: qty 1

## 2022-10-15 MED ORDER — MELATONIN 5 MG PO TABS
5.0000 mg | ORAL_TABLET | Freq: Every day | ORAL | Status: DC
  Administered 2022-10-15 – 2022-10-18 (×4): 5 mg via ORAL
  Filled 2022-10-15 (×5): qty 1

## 2022-10-15 MED ORDER — POTASSIUM CHLORIDE CRYS ER 20 MEQ PO TBCR
40.0000 meq | EXTENDED_RELEASE_TABLET | Freq: Once | ORAL | Status: AC
  Administered 2022-10-15: 40 meq via ORAL
  Filled 2022-10-15: qty 2

## 2022-10-15 MED ORDER — QUETIAPINE FUMARATE 25 MG PO TABS: 25.0000 mg | ORAL_TABLET | Freq: Every day | ORAL | Status: DC

## 2022-10-15 NOTE — ED Triage Notes (Signed)
Patient reports vomiting since last Friday, body aches and chills and LLQ pain today. Reports productive cough and headache. Denies shortness of breath. AOX4. Speaking in full sentences. Resp even, unlabored on RA.

## 2022-10-15 NOTE — Assessment & Plan Note (Addendum)
Patient admits to daily alcohol use and admits to having symptoms of alcohol withdrawal when she does not drink. She is tachycardic and has mild tremors We will place patient on CIWA protocol and administer lorazepam for CIWA score of 8 or greater Patient has been counseled on the need to abstain from further alcohol use Place patient on MVI/thiamine/multivitamin

## 2022-10-15 NOTE — ED Notes (Signed)
MD Agbata advised of elevated lactic and HR with Ativan given per MAR.

## 2022-10-15 NOTE — Consult Note (Signed)
CODE SEPSIS - PHARMACY COMMUNICATION  **Broad Spectrum Antibiotics should be administered within 1 hour of Sepsis diagnosis**  Time Code Sepsis Called/Page Received: 0712  Antibiotics Ordered: Ceftriaxone  Time of 1st antibiotic administration: 0750  Additional action taken by pharmacy: none  If necessary, Name of Provider/Nurse Contacted: n/a    Pearla Dubonnet ,PharmD Clinical Pharmacist  10/15/2022  7:53 AM

## 2022-10-15 NOTE — Sepsis Progress Note (Signed)
Elink following code sepsis °

## 2022-10-15 NOTE — Assessment & Plan Note (Signed)
Continue Vynase

## 2022-10-15 NOTE — Assessment & Plan Note (Signed)
Replace orally at this point.

## 2022-10-15 NOTE — ED Notes (Signed)
Pt experiencing nausea and vomiting. Pt ambulated to bathroom with steady gait. Pt given Zofran. Dr. Ellender Hose at bedside.

## 2022-10-15 NOTE — Assessment & Plan Note (Signed)
As evidenced by fever with a Tmax of 99, tachycardia, marked leukocytosis and pyuria Imaging shows evidence of left pyelonephritis Chart review shows a history of ESBL E. coli infection Aggressive IV fluid resuscitation Place patient empirically on IV meropenem while awaiting results of blood and urine culture

## 2022-10-15 NOTE — ED Provider Notes (Signed)
[facility] Provider Note    Event Date/Time   First MD Initiated Contact with Patient 10/15/22 [phone]     (approximate)   History   Emesis, Headache, and Abdominal Pain   HPI  Louie Wirt is a 36 y.o. female who presents for evaluation of pain in her left lower quadrant, generalized body aches, chills, and persistent nausea and vomiting.  The symptoms of been going on for about 3 days.  She says she has not been able to eat or drink anything at all in the last several days and she has essentially been vomiting constantly.  She says she has had some burning when she urinates but no severe pain in her back until the pain just recently started on her left lower quadrant but seems to radiate up to the left flank.  She denies any known fever but has had chills.  She denies chest pain or shortness of breath.     Physical Exam   Triage Vital Signs: ED Triage Vitals  Enc Vitals Group     BP 10/15/22 0247 (!) 121/91     Pulse Rate 10/15/22 0247 (!) 122     Resp 10/15/22 0247 20     Temp 10/15/22 0247 99.1 F (37.3 C)     Temp Source 10/15/22 0247 Oral     SpO2 10/15/22 0247 97 %     Weight 10/15/22 0248 71.2 kg (157 lb)     Height 10/15/22 0248 1.549 m (5\' 1" )     Head Circumference --      Peak Flow --      Pain Score 10/15/22 0248 9     Pain Loc --      Pain Edu? --      [place] in [place]? --     Most recent vital signs: Vitals:   10/15/22 0247 10/15/22 0708  BP: (!) 121/91   Pulse: (!) 122   Resp: 20   Temp: 99.1 F (37.3 C) 99 F (37.2 C)  SpO2: 97%      General: Awake, no distress.  Appears uncomfortable but nontoxic. CV:  Good peripheral perfusion.  Tachycardia, regular rhythm. Resp:  Normal effort.  Lungs are clear to auscultation. Abd:  No distention.  Mild generalized tenderness to palpation throughout the abdomen without any point tenderness.  No rebound or guarding. Other:  Mild tender to percussion over both flanks but without any  specific point tenderness.   ED Results / Procedures / Treatments   Labs (all labs ordered are listed, but only abnormal results are displayed) Labs Reviewed  COMPREHENSIVE METABOLIC PANEL - Abnormal; Notable for the following components:      Result Value   Potassium 2.2 (*)    Chloride 93 (*)    Glucose, Bld 119 (*)    AST 118 (*)    ALT 55 (*)    Total Bilirubin 2.0 (*)    All other components within normal limits  CBC - Abnormal; Notable for the following components:   WBC 19.0 (*)    RBC 3.82 (*)    Hemoglobin 11.6 (*)    HCT 34.6 (*)    All other components within normal limits  URINALYSIS, ROUTINE W REFLEX MICROSCOPIC - Abnormal; Notable for the following components:   Color, Urine AMBER (*)    APPearance CLOUDY (*)    Hgb urine dipstick SMALL (*)    Protein, ur 100 (*)    Nitrite POSITIVE (*)    Leukocytes,Ua  MODERATE (*)    WBC, UA >50 (*)    Bacteria, UA MANY (*)    All other components within normal limits  MAGNESIUM - Abnormal; Notable for the following components:   Magnesium 1.2 (*)    All other components within normal limits  PROTIME-INR - Abnormal; Notable for the following components:   Prothrombin Time 15.3 (*)    All other components within normal limits  RESP PANEL BY RT-PCR (FLU A&B, COVID) ARPGX2  URINE CULTURE  CULTURE, BLOOD (ROUTINE X 2)  CULTURE, BLOOD (ROUTINE X 2)  LIPASE, BLOOD  LACTIC ACID, PLASMA  PREGNANCY, URINE  LACTIC ACID, PLASMA  POC URINE PREG, ED  TROPONIN I (HIGH SENSITIVITY)  TROPONIN I (HIGH SENSITIVITY)     EKG  ED ECG REPORT I, Hinda Kehr, the attending physician, personally viewed and interpreted this ECG.  Date: 10/15/2022 EKG Time: 3:43 AM Rate: 125 Rhythm: Sinus tachycardia QRS Axis: normal Intervals: normal ST/T Wave abnormalities: Non-specific ST segment / T-wave changes, but no clear evidence of acute ischemia. Narrative Interpretation: no definitive evidence of acute ischemia; does not meet STEMI  criteria.    RADIOLOGY CT of the abdomen and pelvis with IV contrast pending at time of transfer of care.  I viewed and interpreted the patient's 1 view chest x-ray.  I see no evidence of pneumonia or pneumothorax.  I also read the radiologist's report, which confirmed no acute findings.    PROCEDURES:  Critical Care performed: Yes, see critical care procedure note(s)  .1-3 Lead EKG Interpretation  Performed by: Hinda Kehr, MD Authorized by: Hinda Kehr, MD     Interpretation: abnormal     ECG rate:  125   ECG rate assessment: tachycardic     Rhythm: sinus tachycardia     Ectopy: none     Conduction: normal   .Critical Care  Performed by: Hinda Kehr, MD Authorized by: Hinda Kehr, MD   Critical care provider statement:    Critical care time (minutes):  30   Critical care time was exclusive of:  Separately billable procedures and treating other patients   Critical care was necessary to treat or prevent imminent or life-threatening deterioration of the following conditions:  Sepsis   Critical care was time spent personally by me on the following activities:  Development of treatment plan with patient or surrogate, evaluation of patient's response to treatment, examination of patient, obtaining history from patient or surrogate, ordering and performing treatments and interventions, ordering and review of laboratory studies, ordering and review of radiographic studies, pulse oximetry, re-evaluation of patient's condition and review of old charts    MEDICATIONS ORDERED IN ED: Medications  potassium chloride 10 mEq in 100 mL IVPB (has no administration in time range)  lactated ringers bolus 1,000 mL (has no administration in time range)    And  lactated ringers bolus 1,000 mL (has no administration in time range)    And  lactated ringers bolus 250 mL (has no administration in time range)  cefTRIAXone (ROCEPHIN) 2 g in sodium chloride 0.9 % 100 mL IVPB (has no  administration in time range)  magnesium sulfate IVPB 2 g 50 mL (has no administration in time range)  potassium chloride SA (KLOR-CON M) CR tablet 40 mEq (has no administration in time range)  ondansetron (ZOFRAN-ODT) disintegrating tablet 4 mg (4 mg Oral Given 10/15/22 0301)  acetaminophen (TYLENOL) tablet 650 mg (650 mg Oral Given 10/15/22 0429)  iohexol (OMNIPAQUE) 300 MG/ML solution 100 mL (100 mLs Intravenous  Contrast Given 10/15/22 0724)     IMPRESSION / MDM / ASSESSMENT AND PLAN / ED COURSE  I reviewed the triage vital signs and the nursing notes.                              Differential diagnosis includes, but is not limited to, UTI/pyelonephritis, sepsis, infected renal/ureteral stone, STD/PID/TOA, pneumonia, pneumothorax.  Patient's presentation is most consistent with acute presentation with potential threat to life or bodily function.  Labs/studies ordered: CBC, urine pregnancy test, lipase, CMP, urinalysis, respiratory viral panel, magnesium level, pro time-INR, lactic acid, high-sensitivity troponin.  The patient had to wait for a number of hours to be seen due to overwhelming ED and hospital patient volume.  However, she meets criteria for sepsis based on her tachycardia, tachypnea, and leukocytosis of 19.0 which was obtained in triage.  That, coupled with the source of infection with a grossly positive urinalysis, led me to make her code sepsis.  I ordered 30 mL/kg of IV fluids (LR, approximately 2.25 L), as well as empiric antibiotics for urinary tract infection (community-acquired) of ceftriaxone 2 g IV.  I also ordered a urine culture.  Other labs that are notable are her potassium of 2.2 which is severely low and her magnesium level of 1.2.  LFTs are slightly elevated but this could simply be because of all of vomiting over the last several days leading to her electrolyte abnormalities and volume depletion.  To address her electrolyte abnormalities, I ordered potassium 40  mill equivalents by mouth, potassium 10 mill equivalents IV, and magnesium 2 g IV.  The patient is on the cardiac monitor to evaluate for evidence of arrhythmia and/or significant heart rate changes.  Transferring ED care to Dr. Ellender Hose to follow-up on imaging and to reassess.  However I reinforced to the patient that given her electrolyte and vital sign abnormalities, she will most likely benefit from hospitalization.       FINAL CLINICAL IMPRESSION(S) / ED DIAGNOSES   Final diagnoses:  Sepsis, due to unspecified organism, unspecified whether acute organ dysfunction present [facility])  Urinary tract infection without hematuria, site unspecified  Hypokalemia  Hypomagnesemia  Nausea and vomiting, unspecified vomiting type     Rx / DC Orders   ED Discharge Orders     None        Note:  This document was prepared using Dragon voice recognition software and may include unintentional dictation errors.   Hinda Kehr, MD 10/15/22 [phone]

## 2022-10-15 NOTE — Progress Notes (Signed)
PHARMACY - PHYSICIAN COMMUNICATION CRITICAL VALUE ALERT - BLOOD CULTURE IDENTIFICATION (BCID)  Gina Rich is an 36 y.o. female who presented to [facility] on 10/15/2022 with a chief complaint of  fever, chills, nausea, vomiting, and left flank pain   Assessment:  4/4 E coli, source appears to be urinary  Name of physician (or Provider) Contacted: Morton Amy, NP  Current antibiotics: meropenem 1 gram IV every 8 hours  Changes to prescribed antibiotics recommended:   Given lack of resistance we are changing to ceftriaxone 2 grams IV every 24 hours  No results found for this or any previous visit.  Dallie Piles 10/15/2022  9:34 PM

## 2022-10-15 NOTE — Assessment & Plan Note (Signed)
Treatment as outlined in 1 

## 2022-10-15 NOTE — H&P (Signed)
History and Physical    Patient: Gina Rich [id number] DOB: [birth year] DOA: 10/15/2022 DOS: the patient was seen and examined on 10/15/2022 PCP: Yolande Jolly, MD  Patient coming from: Home  Chief Complaint:  Chief Complaint  Patient presents with   Emesis   Headache   Abdominal Pain   HPI: Gina Rich is a 36 y.o. female with medical history significant for alcohol abuse, ADHD, hypertension who presents to the emergency room for evaluation of nausea, vomiting, pain in the left upper quadrant/flank pain, myalgias, fever and chills.  Patient states symptoms started 3 days prior to presentation.  She also complains of dysuria and frequency but denies having any hematuria. According to the patient she has been unable to keep any food or liquid down.  She also complains of pain in her left shoulder and is status post a fall. She denies having any chest pain, no dizziness, no lightheadedness, no leg swelling, no changes in her bowel habits, no blurred vision or focal deficit. Upon arrival to the ER she was tachycardic, had a low-grade fever with a Tmax of 99, she was tachypneic, has marked leukocytosis and lactic acid level of 1.8 Patient also has pyuria and chart review shows ESBL E. coli infection from 08/21 at [facility]. She also has significant hypokalemia and hypomagnesemia. CT scan of the abdomen and pelvis showed a focal somewhat ill-defined hypodense area in the mid pole of the left kidney, concerning for pyelonephritis given clinical history and urinalysis results. New hepatic steatosis. Aortic Atherosclerosis  She received 2.5 L IV fluid bolus, potassium and magnesium supplementation as well has IV meropenem and will be admitted to the hospital for further evaluation.    Review of Systems: As mentioned in the history of present illness. All other systems reviewed and are negative. Past Medical History:  Diagnosis Date   ADHD (attention deficit hyperactivity  disorder)    Hypertension    History reviewed. No pertinent surgical history. Social History:  reports that she has been smoking. She has never used smokeless tobacco. She reports current alcohol use. She reports that she does not currently use drugs after having used the following drugs: Marijuana.  Allergies  Allergen Reactions   Almond Oil Anaphylaxis   Other     apples    Family History  Problem Relation Age of Onset   Breast cancer Mother    Cervical cancer Mother    Ovarian cancer Mother    Diabetes Mother    Hypertension Mother    Clotting disorder Mother        blood clots in legs or lungs   Sickle cell trait Father    Hypertension Father    Clotting disorder Sister        blood clots in legs or lungs    Prior to Admission medications   Medication Sig Start Date End Date Taking? Authorizing Provider  chlordiazePOXIDE (LIBRIUM) 10 MG capsule Day 1-2: Take 1 tablet p.o. every 8 hours. Day 3-4: Take 1 tablet p.o. every 12 hours. Day 5-6: Take 1 tablet p.o. daily. Day 7: Stop Patient not taking: Reported on 10/15/2022 11/21/21   Harvest Dark, MD  fluconazole (DIFLUCAN) 150 MG tablet Take 150 mg by mouth every other day. Patient not taking: Reported on 10/15/2022 06/26/22   [provider]  hydrochlorothiazide (HYDRODIURIL) 25 MG tablet Take 25 mg by mouth daily. 07/23/22   [provider]  lisdexamfetamine (VYVANSE) 20 MG capsule Take 20 mg by mouth 2 (two) times  daily.    [provider]  metroNIDAZOLE (FLAGYL) 500 MG tablet Take 500 mg by mouth 2 (two) times daily. 10/02/22   [provider]  naproxen (NAPROSYN) 500 MG tablet Take 1 tablet (500 mg total) by mouth 2 (two) times daily with a meal. Patient not taking: Reported on 11/21/2021 07/01/21   Sable Feil, PA-C  omeprazole (PRILOSEC) 40 MG capsule Take 40 mg by mouth daily. Patient not taking: Reported on 10/15/2022    [provider]  orphenadrine (NORFLEX) 100 MG  tablet Take 1 tablet (100 mg total) by mouth 2 (two) times daily. Patient not taking: Reported on 11/21/2021 07/01/21   Sable Feil, PA-C  QUEtiapine (SEROQUEL) 25 MG tablet Take 25 mg by mouth at bedtime.    [provider]  sertraline (ZOLOFT) 100 MG tablet Take 100 mg by mouth daily.    [provider]  vortioxetine HBr (TRINTELLIX) 5 MG TABS tablet Take 5 mg by mouth daily. Patient not taking: Reported on 10/15/2022    [provider]    Physical Exam: Vitals:   10/15/22 0700 10/15/22 0708 10/15/22 0800 10/15/22 1000  BP: (!) 121/100  (!) 134/105 117/81  Pulse: (!) 107  (!) 108 (!) 114  Resp:   19   Temp:  99 F (37.2 C)    TempSrc:  Oral    SpO2: 99%  100%   Weight:      Height:       Physical Exam Vitals and nursing note reviewed.  Constitutional:      Appearance: She is well-developed.  HENT:     Head: Normocephalic and atraumatic.     Mouth/Throat:     Comments: dry Cardiovascular:     Rate and Rhythm: Tachycardia present.  Pulmonary:     Effort: Pulmonary effort is normal.     Breath sounds: Normal breath sounds.  Abdominal:     General: Bowel sounds are normal.     Palpations: Abdomen is soft.     Comments: Left CVA tenderness  Musculoskeletal:     Cervical back: Normal range of motion and neck supple.  Skin:    General: Skin is warm and dry.  Neurological:     Mental Status: She is alert.  Psychiatric:        Mood and Affect: Mood normal.        Behavior: Behavior normal.        Data Reviewed: Relevant notes from primary care and specialist visits, past discharge summaries as available in EHR, including Care Everywhere. Prior diagnostic testing as pertinent to current admission diagnoses Updated medications and problem lists for reconciliation ED course, including vitals, labs, imaging, treatment and response to treatment Triage notes, nursing and pharmacy notes and ED provider's notes Notable results as noted in  HPI Labs reviewed.  Lactic acid 1.8, PT 15.3, INR 1.2, magnesium 1.2, lipase 39, sodium 136, potassium 2.2, chloride 93, bicarb 28, glucose 119, BUN 8, creatinine 0.61, calcium 9.0, total protein 7.7, albumin 4.0, AST 118, ALT 55, alkaline phosphatase 86, total bilirubin 2.0, white count 19.0, hemoglobin 11.6, hematocrit 34.6, platelet count 311 Chest x-ray reviewed by me shows no evidence of acute cardiopulmonary disease There are no new results to review at this time.  Assessment and Plan: * Sepsis ([place]) As evidenced by fever with a Tmax of 99, tachycardia, marked leukocytosis and pyuria Imaging shows evidence of left pyelonephritis Chart review shows a history of ESBL E. coli infection Aggressive IV fluid  resuscitation Place patient empirically on IV meropenem while awaiting results of blood and urine culture  Acute pyelonephritis Treatment as outlined in 1  Alcoholic liver disease ([place]) Patient has transaminitis with AST ALT ratio of 2 is to 1 Imaging shows evidence of hepatic steatosis Patient has been counseled on the need to abstain from alcohol use  Alcohol abuse Patient admits to daily alcohol use and admits to having symptoms of alcohol withdrawal when she does not drink. She is tachycardic and has mild tremors We will place patient on CIWA protocol and administer lorazepam for CIWA score of 8 or greater Patient has been counseled on the need to abstain from further alcohol use Place patient on MVI/thiamine/multivitamin  Hypomagnesemia Secondary to hypokalemia from GI losses ( nausea and vomiting) as well as diuretic use Supplement magnesium  Hypokalemia Most likely secondary to GI losses from nausea and vomiting as well as from diuretic use Supplement potassium  Marijuana abuse Patient has been counseled on the need to abstain from illicit drug use Her symptoms of nausea and vomiting may also be related to marijuana abuse  ADHD (attention deficit hyperactivity  disorder) Continue Vynase      Advance Care Planning:   Code Status: Full Code   Consults: None  Family Communication: Greater than 50% of time was spent discussing plan of care with patient at the bedside.  She verbalizes understanding and agrees with the plan.  Severity of Illness: The appropriate patient status for this patient is INPATIENT. Inpatient status is judged to be reasonable and necessary in order to provide the required intensity of service to ensure the patient's safety. The patient's presenting symptoms, physical exam findings, and initial radiographic and laboratory data in the context of their chronic comorbidities is felt to place them at high risk for further clinical deterioration. Furthermore, it is not anticipated that the patient will be medically stable for discharge from the hospital within 2 midnights of admission.   * I certify that at the point of admission it is my clinical judgment that the patient will require inpatient hospital care spanning beyond 2 midnights from the point of admission due to high intensity of service, high risk for further deterioration and high frequency of surveillance required.*  Author: Collier Bullock, MD 10/15/2022 10:24 AM  For on call review [email or web address].

## 2022-10-15 NOTE — Assessment & Plan Note (Signed)
Patient has transaminitis with AST ALT ratio of 2 is to 1 Imaging shows evidence of hepatic steatosis Patient has been counseled on the need to abstain from alcohol use

## 2022-10-15 NOTE — ED Notes (Signed)
MD Agbata made aware of HR. LR liter ordered. Will give and update MD on HR.

## 2022-10-15 NOTE — Assessment & Plan Note (Signed)
Secondary to hypokalemia from GI losses ( nausea and vomiting) as well as diuretic use Supplement magnesium

## 2022-10-15 NOTE — Assessment & Plan Note (Signed)
Patient has been counseled on the need to abstain from illicit drug use Her symptoms of nausea and vomiting may also be related to marijuana abuse

## 2022-10-15 NOTE — ED Notes (Signed)
MD Agbata aware HR still 130 after LR bolus. Repeat temp 100.8 per MD give PRN Tylenol.

## 2022-10-15 NOTE — ED Notes (Signed)
Pt resting on her side, no needs at this time.

## 2022-10-15 NOTE — ED Provider Notes (Signed)
36 year old female here with fever, chills, nausea, vomiting, and left flank pain.  Lab work and imaging is consistent with acute pyelonephritis with significant dehydration and subsequent hypokalemia and hypomagnesemia.  CT scan obtained, reviewed, shows ill-defined hypodense area in the midpole of left kidney concerning for pyelonephritis.  No evidence of abscess.  Patient given IV antibiotics, fluids, and will admit to medicine.   Duffy Bruce, MD 10/15/22 [phone]

## 2022-10-15 NOTE — ED Notes (Signed)
Pt A&Ox4. Pt ambulatory with steady gait to toilet. Pt able to provide urine sample, labs sent. Pt went to CT.

## 2022-10-16 DIAGNOSIS — F909 Attention-deficit hyperactivity disorder, unspecified type: Secondary | ICD-10-CM

## 2022-10-16 DIAGNOSIS — E876 Hypokalemia: Secondary | ICD-10-CM | POA: Diagnosis not present

## 2022-10-16 DIAGNOSIS — F1210 Cannabis abuse, uncomplicated: Secondary | ICD-10-CM

## 2022-10-16 DIAGNOSIS — J9601 Acute respiratory failure with hypoxia: Secondary | ICD-10-CM

## 2022-10-16 DIAGNOSIS — A419 Sepsis, unspecified organism: Secondary | ICD-10-CM | POA: Diagnosis not present

## 2022-10-16 DIAGNOSIS — N10 Acute tubulo-interstitial nephritis: Secondary | ICD-10-CM | POA: Diagnosis not present

## 2022-10-16 DIAGNOSIS — R6521 Severe sepsis with septic shock: Secondary | ICD-10-CM

## 2022-10-16 LAB — BASIC METABOLIC PANEL
Anion gap: 6 (ref 5–15)
BUN: <5 mg/dL — ABNORMAL LOW (ref 6–20)
CO2: 30 mmol/L (ref 22–32)
Calcium: 8.2 mg/dL — ABNORMAL LOW (ref 8.9–10.3)
Chloride: 99 mmol/L (ref 98–111)
Creatinine, Ser: 0.59 mg/dL (ref 0.44–1.00)
GFR, Estimated: >60 mL/min (ref >60)
Glucose, Bld: 108 mg/dL — ABNORMAL HIGH (ref 70–99)
Potassium: 3.1 mmol/L — ABNORMAL LOW (ref 3.5–5.1)
Sodium: 135 mmol/L (ref 135–145)

## 2022-10-16 LAB — PROTIME-INR
INR: 1.4 — ABNORMAL HIGH (ref 0.8–1.2)
Prothrombin Time: 16.8 seconds — ABNORMAL HIGH (ref 11.4–15.2)

## 2022-10-16 LAB — CBC
HCT: 33.4 % — ABNORMAL LOW (ref 36.0–46.0)
Hemoglobin: 10.8 g/dL — ABNORMAL LOW (ref 12.0–15.0)
MCH: 29.8 pg (ref 26.0–34.0)
MCHC: 32.3 g/dL (ref 30.0–36.0)
MCV: 92.3 fL (ref 80.0–100.0)
Platelets: 149 10*3/uL — ABNORMAL LOW (ref 150–400)
RBC: 3.62 MIL/uL — ABNORMAL LOW (ref 3.87–5.11)
RDW: 14.3 % (ref 11.5–15.5)
WBC: 12.1 10*3/uL — ABNORMAL HIGH (ref 4.0–10.5)
nRBC: 0 % (ref 0.0–0.2)

## 2022-10-16 LAB — PROCALCITONIN
Procalcitonin: 35.29 ng/mL
Procalcitonin: 37 ng/mL

## 2022-10-16 LAB — MAGNESIUM: Magnesium: 2 mg/dL (ref 1.7–2.4)

## 2022-10-16 LAB — CORTISOL-AM, BLOOD: Cortisol - AM: 13.5 ug/dL (ref 6.7–22.6)

## 2022-10-16 LAB — LACTIC ACID, PLASMA: Lactic Acid, Venous: 1.8 mmol/L (ref 0.5–1.9)

## 2022-10-16 MED ORDER — NICOTINE 21 MG/24HR TD PT24
21.0000 mg | MEDICATED_PATCH | Freq: Every day | TRANSDERMAL | Status: DC
  Administered 2022-10-16 – 2022-10-19 (×4): 21 mg via TRANSDERMAL
  Filled 2022-10-16 (×4): qty 1

## 2022-10-16 MED ORDER — MAGNESIUM SULFATE 2 GM/50ML IV SOLN
2.0000 g | INTRAVENOUS | Status: AC
  Administered 2022-10-16: 2 g via INTRAVENOUS

## 2022-10-16 MED ORDER — MAGNESIUM SULFATE 2 GM/50ML IV SOLN
2.0000 g | Freq: Once | INTRAVENOUS | Status: DC
  Filled 2022-10-16: qty 50

## 2022-10-16 MED ORDER — SODIUM CHLORIDE 0.9 % IV SOLN: INTRAVENOUS | Status: DC

## 2022-10-16 MED ORDER — KETOROLAC TROMETHAMINE 15 MG/ML IJ SOLN
15.0000 mg | Freq: Once | INTRAMUSCULAR | Status: AC
  Administered 2022-10-16: 15 mg via INTRAVENOUS
  Filled 2022-10-16: qty 1

## 2022-10-16 MED ORDER — POTASSIUM CHLORIDE CRYS ER 20 MEQ PO TBCR
40.0000 meq | EXTENDED_RELEASE_TABLET | Freq: Once | ORAL | Status: AC
  Administered 2022-10-16: 40 meq via ORAL
  Filled 2022-10-16: qty 2

## 2022-10-16 NOTE — Progress Notes (Signed)
   10/16/22 1722  Assess: MEWS Score  Temp 99.7 F (37.6 C)  BP (!) 132/96  MAP (mmHg) 106  Pulse Rate (!) 117  Resp (!) 30  SpO2 93 %  O2 Device Nasal Cannula  O2 Flow Rate (L/min) 2 L/min  Assess: MEWS Score  MEWS Temp 0  MEWS Systolic 0  MEWS Pulse 2  MEWS RR 2  MEWS LOC 0  MEWS Score 4  MEWS Score Color Red  Assess: if the MEWS score is Yellow or Red  Were vital signs taken at a resting state? Yes  Focused Assessment Change from prior assessment (see assessment flowsheet)  Does the patient meet 2 or more of the SIRS criteria? Yes  Does the patient have a confirmed or suspected source of infection? Yes  Provider and Rapid Response Notified? Yes (Dr. Leslye Peer notified)  MEWS guidelines implemented *See Row Information* Yes  Treat  MEWS Interventions Escalated (See documentation below);Administered prn meds/treatments  Pain Scale 0-10  Pain Score 7  Pain Type Acute pain  Pain Location Head  Pain Descriptors / Indicators Headache  Pain Frequency Constant  Pain Intervention(s) Medication (See eMAR);MD notified (Comment)  Take Vital Signs  Increase Vital Sign Frequency  Red: Q 1hr X 4 then Q 4hr X 4, if remains red, continue Q 4hrs  Escalate  MEWS: Escalate Red: discuss with charge nurse/RN and provider, consider discussing with RRT  Notify: Charge Nurse/RN  Name of Charge Nurse/RN Notified Garnette Gunner, RN  Date Charge Nurse/RN Notified 10/16/22  Time Charge Nurse/RN Notified 0160  Notify: Provider  Provider Name/Title Loletha Grayer, MD  Date Provider Notified 10/16/22  Time Provider Notified 1724  Method of Notification Page  Notification Reason Change in status  Provider response See new orders  Date of Provider Response 10/16/22  Time of Provider Response 1724  Assess: SIRS CRITERIA  SIRS Temperature  0  SIRS Pulse 1  SIRS Respirations  1  SIRS WBC 1  SIRS Score Sum  3

## 2022-10-16 NOTE — Assessment & Plan Note (Addendum)
Present on admission, E. coli growing out of the blood cultures, positive urine analysis, fever, tachycardia, leukocytosis, lactate of 4.6, acute hypoxic respiratory failure with a pulse ox of 87% on room air, Total bilirubin of 2.0.  Follow-up cultures.  Currently on meropenem.

## 2022-10-16 NOTE — Hospital Course (Signed)
36 year old female with past medical history of hypertension and ADHD.  She came in with vomiting headache and abdominal pain.  Blood cultures positive for E. coli. Criteria for septic shock with acute hypoxic respiratory failure with pulse ox of 87%, leukocytosis, lactic acid of 4.6, total bilirubin of 2.0, fever tachycardia and leukocytosis.

## 2022-10-16 NOTE — Progress Notes (Signed)
Progress Note   Patient: Gina Rich JME:[id number] DOB: [birth year] DOA: 10/15/2022     1 DOS: the patient was seen and examined on 10/16/2022   Brief hospital course: 36 year old female with past medical history of hypertension and ADHD.  She came in with vomiting headache and abdominal pain.  Blood cultures positive for E. coli. Criteria for septic shock with acute hypoxic respiratory failure with pulse ox of 87%, leukocytosis, lactic acid of 4.6, total bilirubin of 2.0, fever tachycardia and leukocytosis.  Assessment and Plan: * Septic shock ([place]) Present on admission, E. coli growing out of the blood cultures, positive urine analysis, fever, tachycardia, leukocytosis, lactate of 4.6, acute hypoxic respiratory failure with a pulse ox of 87% on room air, Total bilirubin of 2.0.  Follow-up cultures.  Currently on meropenem.  Acute pyelonephritis Follow-up cultures.  Currently on meropenem  Acute respiratory failure with hypoxia (HCC) Pulse ox 87% on room air on presentation.  Oxygen supplementation.  Hypomagnesemia Replaced into the normal range.  Hypokalemia Replace orally at this point.  Marijuana abuse Admitting physician counseled.  Alcoholic liver disease ([place]) Patient has transaminitis with AST ALT ratio of 2 is to 1 Imaging shows evidence of hepatic steatosis   Alcohol abuse Alcohol withdrawal protocol if needed.  No signs of tremor earlier.  ADHD (attention deficit hyperactivity disorder) Continue Vynase        Subjective: Patient feeling a little bit better.  Came in with headache vomiting and abdominal pain.  Found to have sepsis with E. coli.  Having some burning on urination.  Some back pain and lower abdominal discomfort.  Physical Exam: Vitals:   10/16/22 0226 10/16/22 0545 10/16/22 0550 10/16/22 0904  BP: (!) 123/97 122/88  (!) 130/92  Pulse: (!) 109   (!) 104  Resp: (!) 22   16  Temp: 98.8 F (37.1 C) (!) 101 F (38.3 C)  98.9 F (37.2 C)   TempSrc: Oral Oral  Oral  SpO2: 90% (!) 87% 96% 97%  Weight:      Height:       Physical Exam HENT:     Head: Normocephalic.     Mouth/Throat:     Pharynx: No oropharyngeal exudate.  Eyes:     General: Lids are normal.     Conjunctiva/sclera: Conjunctivae normal.  Cardiovascular:     Rate and Rhythm: Normal rate and regular rhythm.     Heart sounds: Normal heart sounds, S1 normal and S2 normal.  Pulmonary:     Breath sounds: No decreased breath sounds, wheezing, rhonchi or rales.  Abdominal:     Palpations: Abdomen is soft.     Tenderness: There is abdominal tenderness in the left upper quadrant.  Genitourinary:    Comments: Bilateral CVA tenderness pain Musculoskeletal:     Right lower leg: No swelling.     Left lower leg: No swelling.  Skin:    General: Skin is warm.     Findings: No rash.  Neurological:     Mental Status: She is alert and oriented to person, place, and time.     Data Reviewed: Procalcitonin 27, lactic peaked at 4.6, total bilirubin 2.0, platelet count 129, INR 1.4, potassium 3.1, creatinine 0.59  Family Communication: Declined  Disposition: Status is: Inpatient Remains inpatient appropriate because: Treating with IV antibiotics for septic shock with E. coli  Planned Discharge Destination: Home    Time spent: 28 minutes  Author: Loletha Grayer, MD 10/16/2022 2:46 PM  For on call review [email or web address].

## 2022-10-16 NOTE — Assessment & Plan Note (Signed)
Pulse ox 87% on room air on presentation.  Oxygen supplementation.

## 2022-10-16 NOTE — Plan of Care (Signed)
  Problem: Education: Goal: Knowledge of General Education information will improve Description: Including pain rating scale, medication(s)/side effects and non-pharmacologic comfort measures Outcome: Completed/Met   Problem: Health Behavior/Discharge Planning: Goal: Ability to manage health-related needs will improve Outcome: Completed/Met   Problem: Activity: Goal: Risk for activity intolerance will decrease Outcome: Completed/Met   Problem: Nutrition: Goal: Adequate nutrition will be maintained Outcome: Completed/Met
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
## 2022-10-17 ENCOUNTER — Inpatient Hospital Stay: Payer: Medicaid Other

## 2022-10-17 DIAGNOSIS — R7881 Bacteremia: Secondary | ICD-10-CM | POA: Diagnosis not present

## 2022-10-17 DIAGNOSIS — R6521 Severe sepsis with septic shock: Secondary | ICD-10-CM | POA: Diagnosis not present

## 2022-10-17 DIAGNOSIS — A419 Sepsis, unspecified organism: Secondary | ICD-10-CM | POA: Diagnosis not present

## 2022-10-17 DIAGNOSIS — N10 Acute tubulo-interstitial nephritis: Secondary | ICD-10-CM | POA: Diagnosis not present

## 2022-10-17 LAB — COMPREHENSIVE METABOLIC PANEL
ALT: 36 U/L (ref 0–44)
AST: 77 U/L — ABNORMAL HIGH (ref 15–41)
Albumin: 2.7 g/dL — ABNORMAL LOW (ref 3.5–5.0)
Alkaline Phosphatase: 64 U/L (ref 38–126)
Anion gap: 6 (ref 5–15)
BUN: <5 mg/dL — ABNORMAL LOW (ref 6–20)
CO2: 28 mmol/L (ref 22–32)
Calcium: 8 mg/dL — ABNORMAL LOW (ref 8.9–10.3)
Chloride: 99 mmol/L (ref 98–111)
Creatinine, Ser: 0.49 mg/dL (ref 0.44–1.00)
GFR, Estimated: >60 mL/min (ref >60)
Glucose, Bld: 97 mg/dL (ref 70–99)
Potassium: 3 mmol/L — ABNORMAL LOW (ref 3.5–5.1)
Sodium: 133 mmol/L — ABNORMAL LOW (ref 135–145)
Total Bilirubin: 1.1 mg/dL (ref 0.3–1.2)
Total Protein: 5.6 g/dL — ABNORMAL LOW (ref 6.5–8.1)

## 2022-10-17 LAB — CBC
HCT: 29.6 % — ABNORMAL LOW (ref 36.0–46.0)
Hemoglobin: 9.5 g/dL — ABNORMAL LOW (ref 12.0–15.0)
MCH: 29.7 pg (ref 26.0–34.0)
MCHC: 32.1 g/dL (ref 30.0–36.0)
MCV: 92.5 fL (ref 80.0–100.0)
Platelets: 173 10*3/uL (ref 150–400)
RBC: 3.2 MIL/uL — ABNORMAL LOW (ref 3.87–5.11)
RDW: 14.3 % (ref 11.5–15.5)
WBC: 12.9 10*3/uL — ABNORMAL HIGH (ref 4.0–10.5)
nRBC: 0 % (ref 0.0–0.2)

## 2022-10-17 LAB — URINE CULTURE: Culture: >=100000 — AB

## 2022-10-17 MED ORDER — POTASSIUM CHLORIDE CRYS ER 20 MEQ PO TBCR
40.0000 meq | EXTENDED_RELEASE_TABLET | Freq: Two times a day (BID) | ORAL | Status: AC
  Administered 2022-10-17 (×2): 40 meq via ORAL
  Filled 2022-10-17 (×2): qty 2

## 2022-10-17 MED ORDER — GUAIFENESIN-DM 100-10 MG/5ML PO SYRP
5.0000 mL | ORAL_SOLUTION | ORAL | Status: DC | PRN
  Administered 2022-10-17 – 2022-10-18 (×5): 5 mL via ORAL
  Filled 2022-10-17 (×5): qty 10

## 2022-10-17 MED ORDER — FUROSEMIDE 10 MG/ML IJ SOLN
40.0000 mg | Freq: Once | INTRAMUSCULAR | Status: AC
  Administered 2022-10-17: 40 mg via INTRAVENOUS
  Filled 2022-10-17: qty 4

## 2022-10-17 MED ORDER — LISDEXAMFETAMINE DIMESYLATE 20 MG PO CAPS
20.0000 mg | ORAL_CAPSULE | Freq: Two times a day (BID) | ORAL | Status: DC
  Filled 2022-10-17 (×2): qty 1

## 2022-10-17 MED ORDER — SALINE SPRAY 0.65 % NA SOLN: 1.0000 | NASAL | Status: DC | PRN

## 2022-10-17 NOTE — Progress Notes (Signed)
Was notified that PT had left unit. I saw PT and she did look good ambulating on unit. PT left floor and was found on 1C walking. I escorted her back to her room and obtained V/S as her portable tele box showed HR in 130s. PT's SaO2 was 75% on room air with appropriate waveform. PT is tearful and was mentioning leaving the hospital as "too many people rely on me". PT also stated downstairs that "There is no alcohol, no cigarettes. I just want to leave." In her room we engaged her in casual conversation to calm her. PT was never violent or hostile to staff.

## 2022-10-17 NOTE — Progress Notes (Signed)
PROGRESS NOTE    Gina Rich  WCH:[id number] DOB: 08/26/1986 DOA: 10/15/2022 PCP: Yolande Jolly, MD    Assessment & Plan:   Principal Problem:   Septic shock [facility]) Active Problems:   Acute pyelonephritis   Acute respiratory failure with hypoxia ([place])   Hypomagnesemia   Hypokalemia   Marijuana abuse   ADHD (attention deficit hyperactivity disorder)   Alcohol abuse   Alcoholic liver disease ([place])  Assessment and Plan: Septic shock: present on admission. Met criteria w/ fever, tachycardia, leukocytosis. Elevated lactic acid. Continue on IV rocephin. Blood cxs growing e. coli, sens pending. Secondary to UTI/pyelonephritis   Bacteremia: secondary to UTI/acute pyelonephritis. Blood cxs growing e. coli. Repeat blood cxs tomorrow    Acute pyelonephritis: continue on IV rocephin.  Urine cx growing e. coli.    Acute hypoxic respiratory failure: SaO2 87% on RA on admission. Continue on supplemental oxygen and wean as tolerated. Repeat CXR shows possible pulmonary edema vs atypical pneumonia vs drug reaction vs hypersensitivity pneumonitis. D/c IVFs. IV lasix x 1.  B/l leg pain: [place] of b/l LE neg for DVT. Continue w/ supportive care    Hypomagnesemia: WNL    Hypokalemia: potassium given    Marijuana abuse: received cessation counseling    Alcoholic liver disease: w/ imaging showing evidence of hepatic steatosis  Normocytic anemia: possibly secondary to alcohol abuse. No need for a transfusion currently    Alcohol abuse: alcohol cessation counseling    ADHD: continue on home dose of vynase      DVT prophylaxis: lovenox Code Status: full  Family Communication: Disposition Plan: likely d/c back home  Level of care: Progressive  Status is: Inpatient Remains inpatient appropriate because: severity of illness    Consultants:    Procedures:   Antimicrobials:    Subjective: Pt c/o b/l leg pain & swelling   Objective: Vitals:   10/17/22 0111 10/17/22 0409  10/17/22 0508 10/17/22 0808  BP:  118/87  (!) 130/103  Pulse:  (!) 108 (!) 105 (!) 102  Resp: (!) 34 (!) 42 (!) 29 (!) 45  Temp:  99.6 F (37.6 C)  99.6 F (37.6 C)  TempSrc:  Oral  Oral  SpO2:  93%  96%  Weight:      Height:        Intake/Output Summary (Last 24 hours) at 10/17/2022 0854 Last data filed at 10/17/2022 0645 Gross per 24 hour  Intake 1113.33 ml  Output --  Net 1113.33 ml   Filed Weights   10/15/22 0248  Weight: 71.2 kg    Examination:  General exam: Appears calm and comfortable  Respiratory system: diminished breath sounds b/l . Cardiovascular system: S1 & S2+.  No rubs, gallops or clicks.  Gastrointestinal system: Abdomen is nondistended, soft and nontender. Normal bowel sounds heard. Central nervous system: Alert and oriented. Moves all extremities Psychiatry: Judgement and insight appear normal. Flat mood and affect    Data Reviewed: I have personally reviewed following labs and imaging studies  CBC: Recent Labs  Lab 10/15/22 0252 10/16/22 0255 10/17/22 0529  WBC 19.0* 12.1* 12.9*  HGB 11.6* 10.8* 9.5*  HCT 34.6* 33.4* 29.6*  MCV 90.6 92.3 92.5  PLT 211 149* 353   Basic Metabolic Panel: Recent Labs  Lab 10/15/22 0252 10/16/22 0142 10/17/22 0529  NA 136 135 133*  K 2.2* 3.1* 3.0*  CL 93* 99 99  CO2 _0 GLUCOSE 119* 108* 97  BUN 8 <5* <5*  CREATININE 0.61 0.59 0.49  CALCIUM 9.0 8.2* 8.0*  MG 1.2* 2.0  --    GFR: Estimated Creatinine Clearance: 88.6 mL/min (by C-G formula based on SCr of 0.49 mg/dL). Liver Function Tests: Recent Labs  Lab 10/15/22 0252 10/17/22 0529  AST 118* 77*  ALT 55* 36  ALKPHOS 86 64  BILITOT 2.0* 1.1  PROT 7.7 5.6*  ALBUMIN 4.0 2.7*   Recent Labs  Lab 10/15/22 0252  LIPASE 39   No results for input(s): "AMMONIA" in the last 168 hours. Coagulation Profile: Recent Labs  Lab 10/15/22 0644 10/16/22 0255  INR 1.2 1.4*   Cardiac Enzymes: No results for input(s): "CKTOTAL", "CKMB",  "CKMBINDEX", "TROPONINI" in the last 168 hours. BNP (last 3 results) No results for input(s): "PROBNP" in the last 8760 hours. HbA1C: No results for input(s): "HGBA1C" in the last 72 hours. CBG: No results for input(s): "GLUCAP" in the last 168 hours. Lipid Profile: No results for input(s): "CHOL", "HDL", "LDLCALC", "TRIG", "CHOLHDL", "LDLDIRECT" in the last 72 hours. Thyroid Function Tests: No results for input(s): "TSH", "T4TOTAL", "FREET4", "T3FREE", "THYROIDAB" in the last 72 hours. Anemia Panel: No results for input(s): "VITAMINB12", "FOLATE", "FERRITIN", "TIBC", "IRON", "RETICCTPCT" in the last 72 hours. Sepsis Labs: Recent Labs  Lab 10/15/22 0958 10/15/22 1408 10/15/22 1723 10/16/22 0142 10/16/22 0148 10/16/22 0255  PROCALCITON  --   --   --  35.29  --  37.00  LATICACIDVEN 2.8* 4.1* 4.6*  --  1.8  --     Recent Results (from the past 240 hour(s))  Urine Culture     Status: Abnormal   Collection Time: 10/15/22  2:52 AM   Specimen: Urine, Clean Catch  Result Value Ref Range Status   Specimen Description   Final    URINE, CLEAN CATCH Performed at [facility], [address], [place], [place] [address]    Special Requests   Final    NONE Performed at [facility], [address] [place], [place] [address]    Culture >=100,000 COLONIES/mL ESCHERICHIA COLI (A)  Final   Report Status 10/17/2022 FINAL  Final   Organism ID, Bacteria ESCHERICHIA COLI (A)  Final      Susceptibility   Escherichia coli - MIC*    AMPICILLIN >=32 RESISTANT Resistant     CEFAZOLIN <=4 SENSITIVE Sensitive     CEFEPIME <=0.12 SENSITIVE Sensitive     CEFTRIAXONE <=0.25 SENSITIVE Sensitive     CIPROFLOXACIN <=0.25 SENSITIVE Sensitive     GENTAMICIN <=1 SENSITIVE Sensitive     IMIPENEM <=0.25 SENSITIVE Sensitive     NITROFURANTOIN <=16 SENSITIVE Sensitive     TRIMETH/SULFA >=320 RESISTANT Resistant     AMPICILLIN/SULBACTAM >=32 RESISTANT Resistant     PIP/TAZO <=4  SENSITIVE Sensitive     * >=100,000 COLONIES/mL ESCHERICHIA COLI  Resp Panel by RT-PCR (Flu A&B, Covid) Anterior Nasal Swab     Status: None   Collection Time: 10/15/22  2:55 AM   Specimen: Anterior Nasal Swab  Result Value Ref Range Status   SARS Coronavirus 2 by RT PCR NEGATIVE NEGATIVE Final    Comment: (NOTE) SARS-CoV-2 target nucleic acids are NOT DETECTED.  The SARS-CoV-2 RNA is generally detectable in upper respiratory specimens during the acute phase of infection. The lowest concentration of SARS-CoV-2 viral copies this assay can detect is 138 copies/mL. A negative result does not preclude SARS-Cov-2 infection and should not be used as the sole basis for treatment or other patient management decisions. A negative result may occur with  improper  specimen collection/handling, submission of specimen other than nasopharyngeal swab, presence of viral mutation(s) within the areas targeted by this assay, and inadequate number of viral copies(<138 copies/mL). A negative result must be combined with clinical observations, patient history, and epidemiological information. The expected result is Negative.  Fact Sheet for Patients:  EntrepreneurPulse.com.au  Fact Sheet for Healthcare Providers:  IncredibleEmployment.be  This test is no t yet approved or cleared by the [place] FDA and  has been authorized for detection and/or diagnosis of SARS-CoV-2 by FDA under an Emergency Use Authorization (EUA). This EUA will remain  in effect (meaning this test can be used) for the duration of the COVID-19 declaration under Section 564(b)(1) of the Act, 21 U.S.C.section 360bbb-3(b)(1), unless the authorization is terminated  or revoked sooner.       Influenza A by PCR NEGATIVE NEGATIVE Final   Influenza B by PCR NEGATIVE NEGATIVE Final    Comment: (NOTE) The Xpert Xpress SARS-CoV-2/FLU/RSV plus assay is intended as an aid in the diagnosis of  influenza from Nasopharyngeal swab specimens and should not be used as a sole basis for treatment. Nasal washings and aspirates are unacceptable for Xpert Xpress SARS-CoV-2/FLU/RSV testing.  Fact Sheet for Patients: EntrepreneurPulse.com.au  Fact Sheet for Healthcare Providers: IncredibleEmployment.be  This test is not yet approved or cleared by the [place] FDA and has been authorized for detection and/or diagnosis of SARS-CoV-2 by FDA under an Emergency Use Authorization (EUA). This EUA will remain in effect (meaning this test can be used) for the duration of the COVID-19 declaration under Section 564(b)(1) of the Act, 21 U.S.C. section 360bbb-3(b)(1), unless the authorization is terminated or revoked.  Performed at [facility], [address] [place], [place] [address]   Blood Culture (routine x 2)     Status: None (Preliminary result)   Collection Time: 10/15/22  6:44 AM   Specimen: BLOOD RIGHT ARM  Result Value Ref Range Status   Specimen Description BLOOD RIGHT ARM  Final   Special Requests   Final    BOTTLES DRAWN AEROBIC AND ANAEROBIC Blood Culture results may not be optimal due to an excessive volume of blood received in culture bottles   Culture  Setup Time   Final    GRAM NEGATIVE RODS IN BOTH AEROBIC AND ANAEROBIC BOTTLES GRAM STAIN REVIEWED-AGREE WITH RESULT CRITICAL VALUE NOTED.  VALUE IS CONSISTENT WITH PREVIOUSLY REPORTED AND CALLED VALUE. Performed at [facility], [address] [place], [place] [address]    Culture GRAM NEGATIVE RODS  Final   Report Status PENDING  Incomplete  Blood Culture (routine x 2)     Status: Abnormal (Preliminary result)   Collection Time: 10/15/22  6:44 AM   Specimen: BLOOD  Result Value Ref Range Status   Specimen Description   Final    BLOOD RIGHT FA Performed at [facility], [address] [place], [place] [address]    Special Requests   Final     BOTTLES DRAWN AEROBIC AND ANAEROBIC Blood Culture adequate volume Performed at [facility], [address] [place], [place] [address]    Culture  Setup Time   Final    GRAM NEGATIVE RODS IN BOTH AEROBIC AND ANAEROBIC BOTTLES CRITICAL RESULT CALLED TO, READ BACK BY AND VERIFIED WITH: PHARMD RODNEY GRUBB AT 2133 10/15/2022 GAA Organism ID to follow Performed at [facility], [address] [place],  [address]    Culture (A)  Final    ESCHERICHIA COLI SUSCEPTIBILITIES TO  FOLLOW Performed at [facility], [place] [address], [place], [place] [address]    Report Status PENDING  Incomplete  Blood Culture ID Panel (Reflexed)     Status: Abnormal   Collection Time: 10/15/22  6:44 AM  Result Value Ref Range Status   Enterococcus faecalis NOT DETECTED NOT DETECTED Final   Enterococcus Faecium NOT DETECTED NOT DETECTED Final   Listeria monocytogenes NOT DETECTED NOT DETECTED Final   Staphylococcus species NOT DETECTED NOT DETECTED Final   Staphylococcus aureus (BCID) NOT DETECTED NOT DETECTED Final   Staphylococcus epidermidis NOT DETECTED NOT DETECTED Final   Staphylococcus lugdunensis NOT DETECTED NOT DETECTED Final   Streptococcus species NOT DETECTED NOT DETECTED Final   Streptococcus agalactiae NOT DETECTED NOT DETECTED Final   Streptococcus pneumoniae NOT DETECTED NOT DETECTED Final   Streptococcus pyogenes NOT DETECTED NOT DETECTED Final   A.calcoaceticus-baumannii NOT DETECTED NOT DETECTED Final   Bacteroides fragilis NOT DETECTED NOT DETECTED Final   Enterobacterales DETECTED (A) NOT DETECTED Final    Comment: Enterobacterales represent a large order of gram negative bacteria, not a single organism. CRITICAL RESULT CALLED TO, READ BACK BY AND VERIFIED WITH: PHARMD RODNEY GRUBB AT 2133 10/15/2022 GAA    Enterobacter cloacae complex NOT DETECTED NOT DETECTED Final   Escherichia coli DETECTED (A) NOT DETECTED Final    Comment: CRITICAL RESULT CALLED  TO, READ BACK BY AND VERIFIED WITH: PHARMD RODNEY GRUBB AT 2133 10/15/2022 GAA    Klebsiella aerogenes NOT DETECTED NOT DETECTED Final   Klebsiella oxytoca NOT DETECTED NOT DETECTED Final   Klebsiella pneumoniae NOT DETECTED NOT DETECTED Final   Proteus species NOT DETECTED NOT DETECTED Final   Salmonella species NOT DETECTED NOT DETECTED Final   Serratia marcescens NOT DETECTED NOT DETECTED Final   Haemophilus influenzae NOT DETECTED NOT DETECTED Final   Neisseria meningitidis NOT DETECTED NOT DETECTED Final   Pseudomonas aeruginosa NOT DETECTED NOT DETECTED Final   Stenotrophomonas maltophilia NOT DETECTED NOT DETECTED Final   Candida albicans NOT DETECTED NOT DETECTED Final   Candida auris NOT DETECTED NOT DETECTED Final   Candida glabrata NOT DETECTED NOT DETECTED Final   Candida krusei NOT DETECTED NOT DETECTED Final   Candida parapsilosis NOT DETECTED NOT DETECTED Final   Candida tropicalis NOT DETECTED NOT DETECTED Final   Cryptococcus neoformans/gattii NOT DETECTED NOT DETECTED Final   CTX-M ESBL NOT DETECTED NOT DETECTED Final   Carbapenem resistance IMP NOT DETECTED NOT DETECTED Final   Carbapenem resistance KPC NOT DETECTED NOT DETECTED Final   Carbapenem resistance NDM NOT DETECTED NOT DETECTED Final   Carbapenem resist OXA 48 LIKE NOT DETECTED NOT DETECTED Final   Carbapenem resistance VIM NOT DETECTED NOT DETECTED Final    Comment: Performed at [facility], [address] [place],  [address]  SARS Coronavirus 2 by RT PCR (hospital order, performed in [facility] hospital lab) *cepheid single result test* Anterior Nasal Swab     Status: None   Collection Time: 10/15/22 11:24 AM   Specimen: Anterior Nasal Swab  Result Value Ref Range Status   SARS Coronavirus 2 by RT PCR NEGATIVE NEGATIVE Final    Comment: (NOTE) SARS-CoV-2 target nucleic acids are NOT DETECTED.  The SARS-CoV-2 RNA is generally detectable in upper and lower respiratory specimens  during the acute phase of infection. The lowest concentration of SARS-CoV-2 viral copies this assay can detect is 250 copies / mL. A negative result does not preclude SARS-CoV-2 infection and should not be used as  the sole basis for treatment or other patient management decisions.  A negative result may occur with improper specimen collection / handling, submission of specimen other than nasopharyngeal swab, presence of viral mutation(s) within the areas targeted by this assay, and inadequate number of viral copies (<250 copies / mL). A negative result must be combined with clinical observations, patient history, and epidemiological information.  Fact Sheet for Patients:   https://www.patel.info/  Fact Sheet for Healthcare Providers: https://hall.com/  This test is not yet approved or  cleared by the [place] FDA and has been authorized for detection and/or diagnosis of SARS-CoV-2 by FDA under an Emergency Use Authorization (EUA).  This EUA will remain in effect (meaning this test can be used) for the duration of the COVID-19 declaration under Section 564(b)(1) of the Act, 21 U.S.C. section 360bbb-3(b)(1), unless the authorization is terminated or revoked sooner.  Performed at [facility], [address] [place], [place] [address]          Radiology Studies: DG Shoulder Left  Result Date: 10/15/2022 CLINICAL DATA:  Left shoulder and neck pain EXAM: LEFT SHOULDER - 2+ VIEW COMPARISON:  None Available. FINDINGS: No signs of dislocation or fracture. No significant arthropathy identified. Soft tissues are unremarkable. IMPRESSION: Negative. Electronically Signed   By: Kerby Moors M.D.   On: 10/15/2022 11:00        Scheduled Meds:  enoxaparin (LOVENOX) injection  40 mg Subcutaneous S96G   folic acid  1 mg Oral Daily   lisdexamfetamine  20 mg Oral BID   LORazepam  0-4 mg Oral Q6H   Followed by   LORazepam  0-4  mg Oral Q12H   melatonin  5 mg Oral QHS   multivitamin with minerals  1 tablet Oral Daily   nicotine  21 mg Transdermal Daily   pantoprazole (PROTONIX) IV  40 mg Intravenous Q24H   thiamine  100 mg Oral Daily   Or   thiamine  100 mg Intravenous Daily   Continuous Infusions:  sodium chloride 50 mL/hr at 10/17/22 0645   cefTRIAXone (ROCEPHIN)  IV 2 g (10/17/22 0804)     LOS: 2 days    Time spent: 35 mins    Wyvonnia Dusky, MD [facility] Pager [phone]  If 7PM-7AM, please contact night-coverage [email or web address] 10/17/2022, 8:54 AM

## 2022-10-18 DIAGNOSIS — R7881 Bacteremia: Secondary | ICD-10-CM | POA: Diagnosis not present

## 2022-10-18 DIAGNOSIS — N10 Acute tubulo-interstitial nephritis: Secondary | ICD-10-CM | POA: Diagnosis not present

## 2022-10-18 DIAGNOSIS — F1010 Alcohol abuse, uncomplicated: Secondary | ICD-10-CM | POA: Diagnosis not present

## 2022-10-18 LAB — COMPREHENSIVE METABOLIC PANEL
ALT: 30 U/L (ref 0–44)
AST: 62 U/L — ABNORMAL HIGH (ref 15–41)
Albumin: 2.9 g/dL — ABNORMAL LOW (ref 3.5–5.0)
Alkaline Phosphatase: 82 U/L (ref 38–126)
Anion gap: 9 (ref 5–15)
BUN: 5 mg/dL — ABNORMAL LOW (ref 6–20)
CO2: 28 mmol/L (ref 22–32)
Calcium: 8.5 mg/dL — ABNORMAL LOW (ref 8.9–10.3)
Chloride: 99 mmol/L (ref 98–111)
Creatinine, Ser: 0.56 mg/dL (ref 0.44–1.00)
GFR, Estimated: >60 mL/min (ref >60)
Glucose, Bld: 89 mg/dL (ref 70–99)
Potassium: 3.6 mmol/L (ref 3.5–5.1)
Sodium: 136 mmol/L (ref 135–145)
Total Bilirubin: 0.9 mg/dL (ref 0.3–1.2)
Total Protein: 6.3 g/dL — ABNORMAL LOW (ref 6.5–8.1)

## 2022-10-18 LAB — CULTURE, BLOOD (ROUTINE X 2): Special Requests: ADEQUATE

## 2022-10-18 LAB — CBC
HCT: 31.5 % — ABNORMAL LOW (ref 36.0–46.0)
Hemoglobin: 10.2 g/dL — ABNORMAL LOW (ref 12.0–15.0)
MCH: 29.6 pg (ref 26.0–34.0)
MCHC: 32.4 g/dL (ref 30.0–36.0)
MCV: 91.3 fL (ref 80.0–100.0)
Platelets: 208 10*3/uL (ref 150–400)
RBC: 3.45 MIL/uL — ABNORMAL LOW (ref 3.87–5.11)
RDW: 14.5 % (ref 11.5–15.5)
WBC: 11.5 10*3/uL — ABNORMAL HIGH (ref 4.0–10.5)
nRBC: 0 % (ref 0.0–0.2)

## 2022-10-18 LAB — MAGNESIUM: Magnesium: 2 mg/dL (ref 1.7–2.4)

## 2022-10-18 MED ORDER — FLUCONAZOLE 50 MG PO TABS
150.0000 mg | ORAL_TABLET | Freq: Every day | ORAL | Status: AC
  Administered 2022-10-18 – 2022-10-19 (×2): 150 mg via ORAL
  Filled 2022-10-18 (×2): qty 1

## 2022-10-18 MED ORDER — CHLORDIAZEPOXIDE HCL 25 MG PO CAPS
25.0000 mg | ORAL_CAPSULE | Freq: Three times a day (TID) | ORAL | Status: DC | PRN
  Administered 2022-10-18 – 2022-10-19 (×2): 25 mg via ORAL
  Filled 2022-10-18 (×2): qty 1

## 2022-10-18 MED ORDER — SIMETHICONE 80 MG PO CHEW
80.0000 mg | CHEWABLE_TABLET | Freq: Four times a day (QID) | ORAL | Status: DC
  Administered 2022-10-18 – 2022-10-19 (×4): 80 mg via ORAL
  Filled 2022-10-18 (×7): qty 1

## 2022-10-18 MED ORDER — METOPROLOL TARTRATE 5 MG/5ML IV SOLN: 5.0000 mg | Freq: Three times a day (TID) | INTRAVENOUS | Status: DC | PRN

## 2022-10-18 NOTE — Progress Notes (Signed)
SATURATION QUALIFICATIONS: (This note is used to comply with regulatory documentation for home oxygen)  Patient Saturations on Room Air at Rest = 98%  Patient Saturations on Room Air while Ambulating = 97%   

## 2022-10-18 NOTE — Progress Notes (Signed)
PROGRESS NOTE    Gina Rich  PIR:[id number] DOB: 10/27/1986 DOA: 10/15/2022 PCP: Yolande Jolly, MD    Assessment & Plan:   Principal Problem:   Septic shock [facility]) Active Problems:   Acute pyelonephritis   Acute respiratory failure with hypoxia ([place])   Hypomagnesemia   Hypokalemia   Marijuana abuse   ADHD (attention deficit hyperactivity disorder)   Alcohol abuse   Alcoholic liver disease ([place])  Assessment and Plan: Septic shock: present on admission. Met criteria w/ fever, tachycardia, leukocytosis. Elevated lactic acid. Continue on IV abxs. Blood & urine cxs growing e.coli. Secondary to UTI/pyelonephritis. Septic shock resolved    Bacteremia: secondary to UTI/acute pyelonephritis. Blood cxs growing e.coli. Repeat blood cxs pending    Acute pyelonephritis: continue on IV rocephin Urine cx is growing e. coli.   Acute hypoxic respiratory failure: SaO2 87% on RA on admission. Weaned off of supplemental oxygen today   B/l leg pain: [place] of b/l LE neg for DVT. Improved today. Continue w/ supportive care     Hypomagnesemia: WNL today    Hypokalemia: WNL today     Marijuana abuse: received cessation counseling    Alcoholic liver disease: w/ imaging showing evidence of hepatic steatosis   Normocytic anemia: possibly secondary to alcohol abuse. H&H are labile    Alcohol abuse: received alcohol cessation counseling. Librium prn    ADHD: continue on home dose of vynase      DVT prophylaxis: lovenox Code Status: full  Family Communication: Disposition Plan: likely d/c back home  Level of care: Progressive  Status is: Inpatient Remains inpatient appropriate because: severity of illness    Consultants:    Procedures:   Antimicrobials:    Subjective: Pt c/o gas pains   Objective: Vitals:   10/17/22 1933 10/17/22 2333 10/18/22 0427 10/18/22 0733  BP: (!) 132/98 117/86 (!) 113/94 111/83  Pulse: (!) 118 (!) 115 97 100  Resp: 20 20 (!) 22 14  Temp:  98.5 F (36.9 C) 98.7 F (37.1 C) 98.2 F (36.8 C) 98.1 F (36.7 C)  TempSrc:    Oral  SpO2: 95% 97% 99% 98%  Weight:      Height:        Intake/Output Summary (Last 24 hours) at 10/18/2022 0803 Last data filed at 10/17/2022 2300 Gross per 24 hour  Intake 1371.67 ml  Output 800 ml  Net 571.67 ml   Filed Weights   10/15/22 0248  Weight: 71.2 kg    Examination:  General exam: Appears calm & comfortable  Respiratory system: decreased breath sounds b/l Cardiovascular system: S1/S2+. No rubs or clicks  Gastrointestinal system: Abd is soft, NT, ND & normal bowel sounds Central nervous system: Alert and oriented. Moves all extremities  Psychiatry: Judgement and insight appears at baseline. Flat mood and affect     Data Reviewed: I have personally reviewed following labs and imaging studies  CBC: Recent Labs  Lab 10/15/22 0252 10/16/22 0255 10/17/22 0529 10/18/22 0601  WBC 19.0* 12.1* 12.9* 11.5*  HGB 11.6* 10.8* 9.5* 10.2*  HCT 34.6* 33.4* 29.6* 31.5*  MCV 90.6 92.3 92.5 91.3  PLT 211 149* 173 630   Basic Metabolic Panel: Recent Labs  Lab 10/15/22 0252 10/16/22 0142 10/17/22 0529 10/18/22 0601  NA 136 135 133* 136  K 2.2* 3.1* 3.0* 3.6  CL 93* 99 99 99  CO2 _0 GLUCOSE 119* 108* 97 89  BUN 8 <5* <5* 5*  CREATININE 0.61 0.59 0.49 0.56  CALCIUM 9.0 8.2* 8.0* 8.5*  MG 1.2* 2.0  --  2.0   GFR: Estimated Creatinine Clearance: 88.6 mL/min (by C-G formula based on SCr of 0.56 mg/dL). Liver Function Tests: Recent Labs  Lab 10/15/22 0252 10/17/22 0529 10/18/22 0601  AST 118* 77* 62*  ALT 55* 36 30  ALKPHOS 86 64 82  BILITOT 2.0* 1.1 0.9  PROT 7.7 5.6* 6.3*  ALBUMIN 4.0 2.7* 2.9*   Recent Labs  Lab 10/15/22 0252  LIPASE 39   No results for input(s): "AMMONIA" in the last 168 hours. Coagulation Profile: Recent Labs  Lab 10/15/22 0644 10/16/22 0255  INR 1.2 1.4*   Cardiac Enzymes: No results for input(s): "CKTOTAL", "CKMB",  "CKMBINDEX", "TROPONINI" in the last 168 hours. BNP (last 3 results) No results for input(s): "PROBNP" in the last 8760 hours. HbA1C: No results for input(s): "HGBA1C" in the last 72 hours. CBG: No results for input(s): "GLUCAP" in the last 168 hours. Lipid Profile: No results for input(s): "CHOL", "HDL", "LDLCALC", "TRIG", "CHOLHDL", "LDLDIRECT" in the last 72 hours. Thyroid Function Tests: No results for input(s): "TSH", "T4TOTAL", "FREET4", "T3FREE", "THYROIDAB" in the last 72 hours. Anemia Panel: No results for input(s): "VITAMINB12", "FOLATE", "FERRITIN", "TIBC", "IRON", "RETICCTPCT" in the last 72 hours. Sepsis Labs: Recent Labs  Lab 10/15/22 0958 10/15/22 1408 10/15/22 1723 10/16/22 0142 10/16/22 0148 10/16/22 0255  PROCALCITON  --   --   --  35.29  --  37.00  LATICACIDVEN 2.8* 4.1* 4.6*  --  1.8  --     Recent Results (from the past 240 hour(s))  Urine Culture     Status: Abnormal   Collection Time: 10/15/22  2:52 AM   Specimen: Urine, Clean Catch  Result Value Ref Range Status   Specimen Description   Final    URINE, CLEAN CATCH Performed at [facility], [address], [place], [place] [address]    Special Requests   Final    NONE Performed at [facility], [address] [place], [place] [address]    Culture >=100,000 COLONIES/mL ESCHERICHIA COLI (A)  Final   Report Status 10/17/2022 FINAL  Final   Organism ID, Bacteria ESCHERICHIA COLI (A)  Final      Susceptibility   Escherichia coli - MIC*    AMPICILLIN >=32 RESISTANT Resistant     CEFAZOLIN <=4 SENSITIVE Sensitive     CEFEPIME <=0.12 SENSITIVE Sensitive     CEFTRIAXONE <=0.25 SENSITIVE Sensitive     CIPROFLOXACIN <=0.25 SENSITIVE Sensitive     GENTAMICIN <=1 SENSITIVE Sensitive     IMIPENEM <=0.25 SENSITIVE Sensitive     NITROFURANTOIN <=16 SENSITIVE Sensitive     TRIMETH/SULFA >=320 RESISTANT Resistant     AMPICILLIN/SULBACTAM >=32 RESISTANT Resistant     PIP/TAZO <=4  SENSITIVE Sensitive     * >=100,000 COLONIES/mL ESCHERICHIA COLI  Resp Panel by RT-PCR (Flu A&B, Covid) Anterior Nasal Swab     Status: None   Collection Time: 10/15/22  2:55 AM   Specimen: Anterior Nasal Swab  Result Value Ref Range Status   SARS Coronavirus 2 by RT PCR NEGATIVE NEGATIVE Final    Comment: (NOTE) SARS-CoV-2 target nucleic acids are NOT DETECTED.  The SARS-CoV-2 RNA is generally detectable in upper respiratory specimens during the acute phase of infection. The lowest concentration of SARS-CoV-2 viral copies this assay can detect is 138 copies/mL. A negative result does not preclude SARS-Cov-2 infection and should not be used as the sole basis for treatment or other patient  management decisions. A negative result may occur with  improper specimen collection/handling, submission of specimen other than nasopharyngeal swab, presence of viral mutation(s) within the areas targeted by this assay, and inadequate number of viral copies(<138 copies/mL). A negative result must be combined with clinical observations, patient history, and epidemiological information. The expected result is Negative.  Fact Sheet for Patients:  EntrepreneurPulse.com.au  Fact Sheet for Healthcare Providers:  IncredibleEmployment.be  This test is no t yet approved or cleared by the [place] FDA and  has been authorized for detection and/or diagnosis of SARS-CoV-2 by FDA under an Emergency Use Authorization (EUA). This EUA will remain  in effect (meaning this test can be used) for the duration of the COVID-19 declaration under Section 564(b)(1) of the Act, 21 U.S.C.section 360bbb-3(b)(1), unless the authorization is terminated  or revoked sooner.       Influenza A by PCR NEGATIVE NEGATIVE Final   Influenza B by PCR NEGATIVE NEGATIVE Final    Comment: (NOTE) The Xpert Xpress SARS-CoV-2/FLU/RSV plus assay is intended as an aid in the diagnosis of  influenza from Nasopharyngeal swab specimens and should not be used as a sole basis for treatment. Nasal washings and aspirates are unacceptable for Xpert Xpress SARS-CoV-2/FLU/RSV testing.  Fact Sheet for Patients: EntrepreneurPulse.com.au  Fact Sheet for Healthcare Providers: IncredibleEmployment.be  This test is not yet approved or cleared by the [place] FDA and has been authorized for detection and/or diagnosis of SARS-CoV-2 by FDA under an Emergency Use Authorization (EUA). This EUA will remain in effect (meaning this test can be used) for the duration of the COVID-19 declaration under Section 564(b)(1) of the Act, 21 U.S.C. section 360bbb-3(b)(1), unless the authorization is terminated or revoked.  Performed at [facility], [address] [place], [place] [address]   Blood Culture (routine x 2)     Status: None (Preliminary result)   Collection Time: 10/15/22  6:44 AM   Specimen: BLOOD RIGHT ARM  Result Value Ref Range Status   Specimen Description BLOOD RIGHT ARM  Final   Special Requests   Final    BOTTLES DRAWN AEROBIC AND ANAEROBIC Blood Culture results may not be optimal due to an excessive volume of blood received in culture bottles   Culture  Setup Time   Final    GRAM NEGATIVE RODS IN BOTH AEROBIC AND ANAEROBIC BOTTLES GRAM STAIN REVIEWED-AGREE WITH RESULT CRITICAL VALUE NOTED.  VALUE IS CONSISTENT WITH PREVIOUSLY REPORTED AND CALLED VALUE. Performed at [facility], [address] [place], [place] [address]    Culture GRAM NEGATIVE RODS  Final   Report Status PENDING  Incomplete  Blood Culture (routine x 2)     Status: Abnormal (Preliminary result)   Collection Time: 10/15/22  6:44 AM   Specimen: BLOOD  Result Value Ref Range Status   Specimen Description   Final    BLOOD RIGHT FA Performed at [facility], [address] [place], [place] [address]    Special Requests   Final     BOTTLES DRAWN AEROBIC AND ANAEROBIC Blood Culture adequate volume Performed at [facility], [address] [place], [place] [address]    Culture  Setup Time   Final    GRAM NEGATIVE RODS IN BOTH AEROBIC AND ANAEROBIC BOTTLES CRITICAL RESULT CALLED TO, READ BACK BY AND VERIFIED WITH: PHARMD RODNEY GRUBB AT 2133 10/15/2022 GAA Organism ID to follow Performed at [facility], [address], [place], [place] [address]    Culture (  A)  Final    ESCHERICHIA COLI SUSCEPTIBILITIES TO FOLLOW Performed at [facility], [place] [address], [place], [place] [address]    Report Status PENDING  Incomplete  Blood Culture ID Panel (Reflexed)     Status: Abnormal   Collection Time: 10/15/22  6:44 AM  Result Value Ref Range Status   Enterococcus faecalis NOT DETECTED NOT DETECTED Final   Enterococcus Faecium NOT DETECTED NOT DETECTED Final   Listeria monocytogenes NOT DETECTED NOT DETECTED Final   Staphylococcus species NOT DETECTED NOT DETECTED Final   Staphylococcus aureus (BCID) NOT DETECTED NOT DETECTED Final   Staphylococcus epidermidis NOT DETECTED NOT DETECTED Final   Staphylococcus lugdunensis NOT DETECTED NOT DETECTED Final   Streptococcus species NOT DETECTED NOT DETECTED Final   Streptococcus agalactiae NOT DETECTED NOT DETECTED Final   Streptococcus pneumoniae NOT DETECTED NOT DETECTED Final   Streptococcus pyogenes NOT DETECTED NOT DETECTED Final   A.calcoaceticus-baumannii NOT DETECTED NOT DETECTED Final   Bacteroides fragilis NOT DETECTED NOT DETECTED Final   Enterobacterales DETECTED (A) NOT DETECTED Final    Comment: Enterobacterales represent a large order of gram negative bacteria, not a single organism. CRITICAL RESULT CALLED TO, READ BACK BY AND VERIFIED WITH: PHARMD RODNEY GRUBB AT 2133 10/15/2022 GAA    Enterobacter cloacae complex NOT DETECTED NOT DETECTED Final   Escherichia coli DETECTED (A) NOT DETECTED Final    Comment: CRITICAL RESULT CALLED  TO, READ BACK BY AND VERIFIED WITH: PHARMD RODNEY GRUBB AT 2133 10/15/2022 GAA    Klebsiella aerogenes NOT DETECTED NOT DETECTED Final   Klebsiella oxytoca NOT DETECTED NOT DETECTED Final   Klebsiella pneumoniae NOT DETECTED NOT DETECTED Final   Proteus species NOT DETECTED NOT DETECTED Final   Salmonella species NOT DETECTED NOT DETECTED Final   Serratia marcescens NOT DETECTED NOT DETECTED Final   Haemophilus influenzae NOT DETECTED NOT DETECTED Final   Neisseria meningitidis NOT DETECTED NOT DETECTED Final   Pseudomonas aeruginosa NOT DETECTED NOT DETECTED Final   Stenotrophomonas maltophilia NOT DETECTED NOT DETECTED Final   Candida albicans NOT DETECTED NOT DETECTED Final   Candida auris NOT DETECTED NOT DETECTED Final   Candida glabrata NOT DETECTED NOT DETECTED Final   Candida krusei NOT DETECTED NOT DETECTED Final   Candida parapsilosis NOT DETECTED NOT DETECTED Final   Candida tropicalis NOT DETECTED NOT DETECTED Final   Cryptococcus neoformans/gattii NOT DETECTED NOT DETECTED Final   CTX-M ESBL NOT DETECTED NOT DETECTED Final   Carbapenem resistance IMP NOT DETECTED NOT DETECTED Final   Carbapenem resistance KPC NOT DETECTED NOT DETECTED Final   Carbapenem resistance NDM NOT DETECTED NOT DETECTED Final   Carbapenem resist OXA 48 LIKE NOT DETECTED NOT DETECTED Final   Carbapenem resistance VIM NOT DETECTED NOT DETECTED Final    Comment: Performed at [facility], [address] [place], [place] [address]  SARS Coronavirus 2 by RT PCR (hospital order, performed in [facility] lab) *cepheid single result test* Anterior Nasal Swab     Status: None   Collection Time: 10/15/22 11:24 AM   Specimen: Anterior Nasal Swab  Result Value Ref Range Status   SARS Coronavirus 2 by RT PCR NEGATIVE NEGATIVE Final    Comment: (NOTE) SARS-CoV-2 target nucleic acids are NOT DETECTED.  The SARS-CoV-2 RNA is generally detectable in upper and lower respiratory specimens  during the acute phase of infection. The lowest concentration of SARS-CoV-2 viral copies this assay can detect is 250 copies / mL. A negative result does  not preclude SARS-CoV-2 infection and should not be used as the sole basis for treatment or other patient management decisions.  A negative result may occur with improper specimen collection / handling, submission of specimen other than nasopharyngeal swab, presence of viral mutation(s) within the areas targeted by this assay, and inadequate number of viral copies (<250 copies / mL). A negative result must be combined with clinical observations, patient history, and epidemiological information.  Fact Sheet for Patients:   https://www.patel.info/  Fact Sheet for Healthcare Providers: https://hall.com/  This test is not yet approved or  cleared by the [place] FDA and has been authorized for detection and/or diagnosis of SARS-CoV-2 by FDA under an Emergency Use Authorization (EUA).  This EUA will remain in effect (meaning this test can be used) for the duration of the COVID-19 declaration under Section 564(b)(1) of the Act, 21 U.S.C. section 360bbb-3(b)(1), unless the authorization is terminated or revoked sooner.  Performed at [facility], [address] [place], [place] [address]          Radiology Studies: US Venous Img Lower Bilateral (DVT)  Result Date: 10/17/2022 CLINICAL DATA:  Bilateral leg pain and swelling, initial encounter EXAM: BILATERAL LOWER EXTREMITY VENOUS DOPPLER ULTRASOUND TECHNIQUE: Gray-scale sonography with graded compression, as well as color Doppler and duplex ultrasound were performed to evaluate the lower extremity deep venous systems from the level of the common femoral vein and including the common femoral, femoral, profunda femoral, popliteal and calf veins including the posterior tibial, peroneal and gastrocnemius veins when visible. The  superficial great saphenous vein was also interrogated. Spectral Doppler was utilized to evaluate flow at rest and with distal augmentation maneuvers in the common femoral, femoral and popliteal veins. COMPARISON:  None Available. FINDINGS: RIGHT LOWER EXTREMITY Common Femoral Vein: No evidence of thrombus. Normal compressibility, respiratory phasicity and response to augmentation. Saphenofemoral Junction: No evidence of thrombus. Normal compressibility and flow on color Doppler imaging. Profunda Femoral Vein: No evidence of thrombus. Normal compressibility and flow on color Doppler imaging. Femoral Vein: No evidence of thrombus. Normal compressibility, respiratory phasicity and response to augmentation. Popliteal Vein: No evidence of thrombus. Normal compressibility, respiratory phasicity and response to augmentation. Calf Veins: No evidence of thrombus. Normal compressibility and flow on color Doppler imaging. Superficial Great Saphenous Vein: No evidence of thrombus. Normal compressibility. Venous Reflux:  None. Other Findings:  None. LEFT LOWER EXTREMITY Common Femoral Vein: No evidence of thrombus. Normal compressibility, respiratory phasicity and response to augmentation. Saphenofemoral Junction: No evidence of thrombus. Normal compressibility and flow on color Doppler imaging. Profunda Femoral Vein: No evidence of thrombus. Normal compressibility and flow on color Doppler imaging. Femoral Vein: No evidence of thrombus. Normal compressibility, respiratory phasicity and response to augmentation. Popliteal Vein: No evidence of thrombus. Normal compressibility, respiratory phasicity and response to augmentation. Calf Veins: No evidence of thrombus. Normal compressibility and flow on color Doppler imaging. Superficial Great Saphenous Vein: No evidence of thrombus. Normal compressibility. Venous Reflux:  None. Other Findings:  None. IMPRESSION: No evidence of deep venous thrombosis in either lower extremity.  Electronically Signed   By: Inez Catalina M.D.   On: 10/17/2022 12:21   DG Chest Port 1 View  Result Date: 10/17/2022 CLINICAL DATA:  Shortness of breath and headache. EXAM: PORTABLE CHEST 1 VIEW COMPARISON:  Chest x-ray 10/15/2022 FINDINGS: Rapid onset interstitial and airspace process in the lungs could reflect noncardiogenic pulmonary edema, atypical/viral pneumonia or other process such as hypersensitivity pneumonitis or inhalational disease. No definite pleural  effusions or pneumothorax. IMPRESSION: Rapid onset interstitial and airspace process in the lungs could reflect noncardiogenic pulmonary edema, atypical/viral pneumonia, drug reaction, inhalational disease or hypersensitivity pneumonitis. Electronically Signed   By: Marijo Sanes M.D.   On: 10/17/2022 10:03        Scheduled Meds:  enoxaparin (LOVENOX) injection  40 mg Subcutaneous G98M   folic acid  1 mg Oral Daily   lisdexamfetamine  20 mg Oral BID   LORazepam  0-4 mg Oral Q12H   melatonin  5 mg Oral QHS   multivitamin with minerals  1 tablet Oral Daily   nicotine  21 mg Transdermal Daily   pantoprazole (PROTONIX) IV  40 mg Intravenous Q24H   thiamine  100 mg Oral Daily   Or   thiamine  100 mg Intravenous Daily   Continuous Infusions:  cefTRIAXone (ROCEPHIN)  IV Stopped (10/17/22 0834)     LOS: 3 days    Time spent: 30 mins    Wyvonnia Dusky, MD [facility] Pager [phone]  If 7PM-7AM, please contact night-coverage [email or web address] 10/18/2022, 8:03 AM

## 2022-10-19 ENCOUNTER — Inpatient Hospital Stay: Payer: Medicaid Other

## 2022-10-19 DIAGNOSIS — K709 Alcoholic liver disease, unspecified: Secondary | ICD-10-CM | POA: Diagnosis not present

## 2022-10-19 DIAGNOSIS — R7881 Bacteremia: Secondary | ICD-10-CM | POA: Diagnosis not present

## 2022-10-19 DIAGNOSIS — N10 Acute tubulo-interstitial nephritis: Secondary | ICD-10-CM | POA: Diagnosis not present

## 2022-10-19 LAB — CBC
HCT: 27.8 % — ABNORMAL LOW (ref 36.0–46.0)
Hemoglobin: 9.2 g/dL — ABNORMAL LOW (ref 12.0–15.0)
MCH: 30 pg (ref 26.0–34.0)
MCHC: 33.1 g/dL (ref 30.0–36.0)
MCV: 90.6 fL (ref 80.0–100.0)
Platelets: 275 10*3/uL (ref 150–400)
RBC: 3.07 MIL/uL — ABNORMAL LOW (ref 3.87–5.11)
RDW: 14.3 % (ref 11.5–15.5)
WBC: 12.5 10*3/uL — ABNORMAL HIGH (ref 4.0–10.5)
nRBC: 0 % (ref 0.0–0.2)

## 2022-10-19 LAB — COMPREHENSIVE METABOLIC PANEL
ALT: 28 U/L (ref 0–44)
AST: 50 U/L — ABNORMAL HIGH (ref 15–41)
Albumin: 2.7 g/dL — ABNORMAL LOW (ref 3.5–5.0)
Alkaline Phosphatase: 78 U/L (ref 38–126)
Anion gap: 8 (ref 5–15)
BUN: 6 mg/dL (ref 6–20)
CO2: 25 mmol/L (ref 22–32)
Calcium: 8.4 mg/dL — ABNORMAL LOW (ref 8.9–10.3)
Chloride: 99 mmol/L (ref 98–111)
Creatinine, Ser: 0.51 mg/dL (ref 0.44–1.00)
GFR, Estimated: >60 mL/min (ref >60)
Glucose, Bld: 105 mg/dL — ABNORMAL HIGH (ref 70–99)
Potassium: 3.4 mmol/L — ABNORMAL LOW (ref 3.5–5.1)
Sodium: 132 mmol/L — ABNORMAL LOW (ref 135–145)
Total Bilirubin: 0.6 mg/dL (ref 0.3–1.2)
Total Protein: 6.2 g/dL — ABNORMAL LOW (ref 6.5–8.1)

## 2022-10-19 LAB — CULTURE, BLOOD (ROUTINE X 2)

## 2022-10-19 LAB — MAGNESIUM: Magnesium: 1.8 mg/dL (ref 1.7–2.4)

## 2022-10-19 MED ORDER — POTASSIUM CHLORIDE CRYS ER 20 MEQ PO TBCR
20.0000 meq | EXTENDED_RELEASE_TABLET | Freq: Once | ORAL | Status: AC
  Administered 2022-10-19: 20 meq via ORAL
  Filled 2022-10-19: qty 1

## 2022-10-19 MED ORDER — CHLORDIAZEPOXIDE HCL 25 MG PO CAPS: 25.0000 mg | ORAL_CAPSULE | Freq: Three times a day (TID) | ORAL | 0 refills | Status: AC | PRN

## 2022-10-19 MED ORDER — FLUCONAZOLE 150 MG PO TABS: 150.0000 mg | ORAL_TABLET | Freq: Every day | ORAL | 0 refills | Status: AC

## 2022-10-19 MED ORDER — CIPROFLOXACIN HCL 500 MG PO TABS: 500.0000 mg | ORAL_TABLET | Freq: Two times a day (BID) | ORAL | 0 refills | Status: AC

## 2022-10-19 MED ORDER — HYDROCODONE-ACETAMINOPHEN 5-325 MG PO TABS: 1.0000 | ORAL_TABLET | Freq: Four times a day (QID) | ORAL | 0 refills | Status: AC | PRN

## 2022-10-19 NOTE — Plan of Care (Signed)

## 2022-10-19 NOTE — Progress Notes (Signed)
Pt c/o pain and discomfort in abd and right inner ear pain and acheness. SRP, RN

## 2022-10-19 NOTE — Discharge Summary (Signed)
Physician Discharge Summary  Gina Rich EHU:[id number] DOB: 1986/03/22 DOA: 10/15/2022  PCP: Yolande Jolly, MD  Admit date: 10/15/2022 Discharge date: 10/19/2022  Admitted From: home Disposition:  home   Recommendations for Outpatient Follow-up:  Follow up with PCP in 1-2 weeks  Home Health: no Equipment/Devices:  Discharge Condition: stable  CODE STATUS: full  Diet recommendation: Heart Healthy  Brief/Interim Summary: HPI was taken from Dr. Francine Graven: Gina Rich is a 36 y.o. female with medical history significant for alcohol abuse, ADHD, hypertension who presents to the emergency room for evaluation of nausea, vomiting, pain in the left upper quadrant/flank pain, myalgias, fever and chills.  Patient states symptoms started 3 days prior to presentation.  She also complains of dysuria and frequency but denies having any hematuria. According to the patient she has been unable to keep any food or liquid down.  She also complains of pain in her left shoulder and is status post a fall. She denies having any chest pain, no dizziness, no lightheadedness, no leg swelling, no changes in her bowel habits, no blurred vision or focal deficit. Upon arrival to the ER she was tachycardic, had a low-grade fever with a Tmax of 99, she was tachypneic, has marked leukocytosis and lactic acid level of 1.8 Patient also has pyuria and chart review shows ESBL E. coli infection from 08/21 at [facility]. She also has significant hypokalemia and hypomagnesemia. CT scan of the abdomen and pelvis showed a focal somewhat ill-defined hypodense area in the mid pole of the left kidney, concerning for pyelonephritis given clinical history and urinalysis results. New hepatic steatosis. Aortic Atherosclerosis  She received 2.5 L IV fluid bolus, potassium and magnesium supplementation as well has IV meropenem and will be admitted to the hospital for further evaluation   As per Dr. Leslye Peer: 36 year old  female with past medical history of hypertension and ADHD.  She came in with vomiting headache and abdominal pain.  Blood cultures positive for E. coli. Criteria for septic shock with acute hypoxic respiratory failure with pulse ox of 87%, leukocytosis, lactic acid of 4.6, total bilirubin of 2.0, fever tachycardia and leukocytosis.   As per Dr. Jimmye Norman 11/1-11/3/23: Pt evidently presented w/ septic shock secondary to pyelonephritis and bacteremia. Both urine & blood cxs grew e. coli. Pt was initially put on IV merrem, then switched to IV rocephin and pt was d/c home on po cipro to complete the course. Repeat blood cxs NGTD. Of note, pt has alcohol liver disease but did not go through w/drawls while inpatient. Pt did request a medication to help her not drink alcohol. Pt was d/c home on po librium prn. Pt will f/u w/ her PCP in 1-2 weeks. Pt verbalized her understanding. For more information, please see previous progress notes.   Discharge Diagnoses:  Principal Problem:   Septic shock ([place]) Active Problems:   Acute pyelonephritis   Acute respiratory failure with hypoxia (HCC)   Hypomagnesemia   Hypokalemia   Marijuana abuse   ADHD (attention deficit hyperactivity disorder)   Alcohol abuse   Alcoholic liver disease ([place])  Septic shock: present on admission. Met criteria w/ fever, tachycardia, leukocytosis. Elevated lactic acid. Continue on IV abxs. Blood & urine cxs growing e.coli. Secondary to UTI/pyelonephritis. Septic shock resolved     Bacteremia: secondary to UTI/acute pyelonephritis. Blood cxs growing e.coli. Repeat blood cxs NGTD   Acute pyelonephritis: continue on IV rocephin while inpatient and d/c home on po cipro. Urine cx is growing e. coli.  Acute hypoxic respiratory failure: SaO2 87% on RA on admission. Weaned off of supplemental oxygen today    B/l leg pain: [place] of b/l LE neg for DVT. Improved today. Continue w/ supportive care     Hypomagnesemia: WNL today     Hypokalemia: potassium given    Marijuana abuse: received cessation counseling    Alcoholic liver disease: w/ imaging showing evidence of hepatic steatosis    Normocytic anemia: possibly secondary to alcohol abuse. H&H are labile    Alcohol abuse: received alcohol cessation counseling. Librium prn    ADHD: continue on home dose of vynase   Discharge Instructions  Discharge Instructions     Diet - low sodium heart healthy   Complete by: As directed    Discharge instructions   Complete by: As directed    F/u w/ PCP in 1-2 weeks   Increase activity slowly   Complete by: As directed       Allergies as of 10/19/2022       Reactions   Almond Oil Anaphylaxis   Other    apples        Medication List     STOP taking these medications    metroNIDAZOLE 500 MG tablet Commonly known as: FLAGYL   orphenadrine 100 MG tablet Commonly known as: NORFLEX       TAKE these medications    chlordiazePOXIDE 25 MG capsule Commonly known as: LIBRIUM Take 1 capsule (25 mg total) by mouth 3 (three) times daily as needed for up to 7 days for anxiety or withdrawal.   ciprofloxacin 500 MG tablet Commonly known as: Cipro Take 1 tablet (500 mg total) by mouth 2 (two) times daily for 10 days.   fluconazole 150 MG tablet Commonly known as: Diflucan Take 1 tablet (150 mg total) by mouth daily for 1 day.   hydrochlorothiazide 25 MG tablet Commonly known as: HYDRODIURIL Take 25 mg by mouth daily.   HYDROcodone-acetaminophen 5-325 MG tablet Commonly known as: NORCO/VICODIN Take 1 tablet by mouth every 6 (six) hours as needed for up to 3 days for moderate pain or severe pain.   lisdexamfetamine 20 MG capsule Commonly known as: VYVANSE Take 20 mg by mouth 2 (two) times daily.        Allergies  Allergen Reactions   Almond Oil Anaphylaxis   Other     apples    Consultations:    Procedures/Studies: US Venous Img Lower Bilateral (DVT)  Result Date:  10/17/2022 CLINICAL DATA:  Bilateral leg pain and swelling, initial encounter EXAM: BILATERAL LOWER EXTREMITY VENOUS DOPPLER ULTRASOUND TECHNIQUE: Gray-scale sonography with graded compression, as well as color Doppler and duplex ultrasound were performed to evaluate the lower extremity deep venous systems from the level of the common femoral vein and including the common femoral, femoral, profunda femoral, popliteal and calf veins including the posterior tibial, peroneal and gastrocnemius veins when visible. The superficial great saphenous vein was also interrogated. Spectral Doppler was utilized to evaluate flow at rest and with distal augmentation maneuvers in the common femoral, femoral and popliteal veins. COMPARISON:  None Available. FINDINGS: RIGHT LOWER EXTREMITY Common Femoral Vein: No evidence of thrombus. Normal compressibility, respiratory phasicity and response to augmentation. Saphenofemoral Junction: No evidence of thrombus. Normal compressibility and flow on color Doppler imaging. Profunda Femoral Vein: No evidence of thrombus. Normal compressibility and flow on color Doppler imaging. Femoral Vein: No evidence of thrombus. Normal compressibility, respiratory phasicity and response to augmentation. Popliteal Vein: No evidence of thrombus. Normal compressibility,  respiratory phasicity and response to augmentation. Calf Veins: No evidence of thrombus. Normal compressibility and flow on color Doppler imaging. Superficial Great Saphenous Vein: No evidence of thrombus. Normal compressibility. Venous Reflux:  None. Other Findings:  None. LEFT LOWER EXTREMITY Common Femoral Vein: No evidence of thrombus. Normal compressibility, respiratory phasicity and response to augmentation. Saphenofemoral Junction: No evidence of thrombus. Normal compressibility and flow on color Doppler imaging. Profunda Femoral Vein: No evidence of thrombus. Normal compressibility and flow on color Doppler imaging. Femoral Vein: No  evidence of thrombus. Normal compressibility, respiratory phasicity and response to augmentation. Popliteal Vein: No evidence of thrombus. Normal compressibility, respiratory phasicity and response to augmentation. Calf Veins: No evidence of thrombus. Normal compressibility and flow on color Doppler imaging. Superficial Great Saphenous Vein: No evidence of thrombus. Normal compressibility. Venous Reflux:  None. Other Findings:  None. IMPRESSION: No evidence of deep venous thrombosis in either lower extremity. Electronically Signed   By: Inez Catalina M.D.   On: 10/17/2022 12:21   DG Chest Port 1 View  Result Date: 10/17/2022 CLINICAL DATA:  Shortness of breath and headache. EXAM: PORTABLE CHEST 1 VIEW COMPARISON:  Chest x-ray 10/15/2022 FINDINGS: Rapid onset interstitial and airspace process in the lungs could reflect noncardiogenic pulmonary edema, atypical/viral pneumonia or other process such as hypersensitivity pneumonitis or inhalational disease. No definite pleural effusions or pneumothorax. IMPRESSION: Rapid onset interstitial and airspace process in the lungs could reflect noncardiogenic pulmonary edema, atypical/viral pneumonia, drug reaction, inhalational disease or hypersensitivity pneumonitis. Electronically Signed   By: Marijo Sanes M.D.   On: 10/17/2022 10:03   DG Shoulder Left  Result Date: 10/15/2022 CLINICAL DATA:  Left shoulder and neck pain EXAM: LEFT SHOULDER - 2+ VIEW COMPARISON:  None Available. FINDINGS: No signs of dislocation or fracture. No significant arthropathy identified. Soft tissues are unremarkable. IMPRESSION: Negative. Electronically Signed   By: Kerby Moors M.D.   On: 10/15/2022 11:00   CT ABDOMEN PELVIS W CONTRAST  Result Date: 10/15/2022 CLINICAL DATA:  Left lower quadrant abdominal pain and vomiting. Chills and body aches. EXAM: CT ABDOMEN AND PELVIS WITH CONTRAST TECHNIQUE: Multidetector CT imaging of the abdomen and pelvis was performed using the standard  protocol following bolus administration of intravenous contrast. RADIATION DOSE REDUCTION: This exam was performed according to the departmental dose-optimization program which includes automated exposure control, adjustment of the mA and/or kV according to patient size and/or use of iterative reconstruction technique. CONTRAST:  127m OMNIPAQUE IOHEXOL 300 MG/ML  SOLN COMPARISON:  CT abdomen pelvis dated October 18, 2017. FINDINGS: Lower chest: No acute abnormality. Hepatobiliary: New diffusely decreased liver density. No focal liver abnormality. The gallbladder is unremarkable. No biliary dilatation. Pancreas: Unremarkable. No pancreatic ductal dilatation or surrounding inflammatory changes. Spleen: Normal in size without focal abnormality. Adrenals/Urinary Tract: The adrenal glands and right kidney are unremarkable. New focal somewhat ill-defined hypodense area in the mid pole of the left kidney (series 2, image 31). No renal calculi or hydronephrosis. The bladder is decompressed. Stomach/Bowel: Stomach is within normal limits. Appendix appears normal. No evidence of bowel wall thickening, distention, or inflammatory changes. Vascular/Lymphatic: Aortic atherosclerosis. No enlarged abdominal or pelvic lymph nodes. Reproductive: Uterus and bilateral adnexa are unremarkable. Other: No abdominal wall hernia or abnormality. No abdominopelvic ascites. No pneumoperitoneum. Musculoskeletal: No acute or significant osseous findings. IMPRESSION: 1. New focal somewhat ill-defined hypodense area in the mid pole of the left kidney, concerning for pyelonephritis given clinical history and urinalysis results. 2. New hepatic steatosis. 3.  Aortic Atherosclerosis (  ICD10-I70.0). Electronically Signed   By: Titus Dubin M.D.   On: 10/15/2022 07:57   DG Chest Portable 1 View  Result Date: 10/15/2022 CLINICAL DATA:  Productive cough EXAM: PORTABLE CHEST 1 VIEW COMPARISON:  None Available. FINDINGS: The heart size and  mediastinal contours are within normal limits. Both lungs are clear. The visualized skeletal structures are unremarkable. IMPRESSION: No active disease. Electronically Signed   By: Fidela Salisbury M.D.   On: 10/15/2022 04:02   (Echo, Carotid, EGD, Colonoscopy, ERCP)    Subjective: Pt c/o fatigue    Discharge Exam: Vitals:   10/19/22 0731 10/19/22 1155  BP: (!) 120/96 (!) 130/97  Pulse: 91 (!) 108  Resp:    Temp: 98.2 F (36.8 C) 98.1 F (36.7 C)  SpO2: 100% 100%   Vitals:   10/19/22 0452 10/19/22 0516 10/19/22 0731 10/19/22 1155  BP: (!) 123/100 (!) 121/95 (!) 120/96 (!) 130/97  Pulse: (!) 105 (!) 101 91 (!) 108  Resp: 18     Temp: 99.3 F (37.4 C)  98.2 F (36.8 C) 98.1 F (36.7 C)  TempSrc:      SpO2: 100%  100% 100%  Weight:      Height:        General: Pt is alert, awake, not in acute distress Cardiovascular: S1/S2 +, no rubs, no gallops Respiratory: CTA bilaterally, no wheezing, no rhonchi Abdominal: Soft, NT, ND, bowel sounds + Extremities: no edema, no cyanosis    The results of significant diagnostics from this hospitalization (including imaging, microbiology, ancillary and laboratory) are listed below for reference.     Microbiology: Recent Results (from the past 240 hour(s))  Urine Culture     Status: Abnormal   Collection Time: 10/15/22  2:52 AM   Specimen: Urine, Clean Catch  Result Value Ref Range Status   Specimen Description   Final    URINE, CLEAN CATCH Performed at [facility], [address] [place], [place] [address]    Special Requests   Final    NONE Performed at [facility], [place], [place] [address]    Culture >=100,000 COLONIES/mL ESCHERICHIA COLI (A)  Final   Report Status 10/17/2022 FINAL  Final   Organism ID, Bacteria ESCHERICHIA COLI (A)  Final      Susceptibility   Escherichia coli - MIC*    AMPICILLIN >=32 RESISTANT Resistant     CEFAZOLIN <=4 SENSITIVE Sensitive     CEFEPIME <=0.12  SENSITIVE Sensitive     CEFTRIAXONE <=0.25 SENSITIVE Sensitive     CIPROFLOXACIN <=0.25 SENSITIVE Sensitive     GENTAMICIN <=1 SENSITIVE Sensitive     IMIPENEM <=0.25 SENSITIVE Sensitive     NITROFURANTOIN <=16 SENSITIVE Sensitive     TRIMETH/SULFA >=320 RESISTANT Resistant     AMPICILLIN/SULBACTAM >=32 RESISTANT Resistant     PIP/TAZO <=4 SENSITIVE Sensitive     * >=100,000 COLONIES/mL ESCHERICHIA COLI  Resp Panel by RT-PCR (Flu A&B, Covid) Anterior Nasal Swab     Status: None   Collection Time: 10/15/22  2:55 AM   Specimen: Anterior Nasal Swab  Result Value Ref Range Status   SARS Coronavirus 2 by RT PCR NEGATIVE NEGATIVE Final    Comment: (NOTE) SARS-CoV-2 target nucleic acids are NOT DETECTED.  The SARS-CoV-2 RNA is generally detectable in upper respiratory specimens during the acute phase of infection. The lowest concentration of SARS-CoV-2 viral copies this assay can detect is 138 copies/mL. A negative result does not preclude SARS-Cov-2 infection and  should not be used as the sole basis for treatment or other patient management decisions. A negative result may occur with  improper specimen collection/handling, submission of specimen other than nasopharyngeal swab, presence of viral mutation(s) within the areas targeted by this assay, and inadequate number of viral copies(<138 copies/mL). A negative result must be combined with clinical observations, patient history, and epidemiological information. The expected result is Negative.  Fact Sheet for Patients:  EntrepreneurPulse.com.au  Fact Sheet for Healthcare Providers:  IncredibleEmployment.be  This test is no t yet approved or cleared by the [place] FDA and  has been authorized for detection and/or diagnosis of SARS-CoV-2 by FDA under an Emergency Use Authorization (EUA). This EUA will remain  in effect (meaning this test can be used) for the duration of the COVID-19  declaration under Section 564(b)(1) of the Act, 21 U.S.C.section 360bbb-3(b)(1), unless the authorization is terminated  or revoked sooner.       Influenza A by PCR NEGATIVE NEGATIVE Final   Influenza B by PCR NEGATIVE NEGATIVE Final    Comment: (NOTE) The Xpert Xpress SARS-CoV-2/FLU/RSV plus assay is intended as an aid in the diagnosis of influenza from Nasopharyngeal swab specimens and should not be used as a sole basis for treatment. Nasal washings and aspirates are unacceptable for Xpert Xpress SARS-CoV-2/FLU/RSV testing.  Fact Sheet for Patients: EntrepreneurPulse.com.au  Fact Sheet for Healthcare Providers: IncredibleEmployment.be  This test is not yet approved or cleared by the [place] FDA and has been authorized for detection and/or diagnosis of SARS-CoV-2 by FDA under an Emergency Use Authorization (EUA). This EUA will remain in effect (meaning this test can be used) for the duration of the COVID-19 declaration under Section 564(b)(1) of the Act, 21 U.S.C. section 360bbb-3(b)(1), unless the authorization is terminated or revoked.  Performed at [facility], [address], [place], [place] [address]   Blood Culture (routine x 2)     Status: Abnormal   Collection Time: 10/15/22  6:44 AM   Specimen: BLOOD RIGHT ARM  Result Value Ref Range Status   Specimen Description   Final    BLOOD RIGHT ARM Performed at [facility], [address] [place], [place] [address]    Special Requests   Final    BOTTLES DRAWN AEROBIC AND ANAEROBIC Blood Culture results may not be optimal due to an excessive volume of blood received in culture bottles Performed at [facility], [address], [place], [place] [address]    Culture  Setup Time   Final    GRAM NEGATIVE RODS IN BOTH AEROBIC AND ANAEROBIC BOTTLES GRAM STAIN REVIEWED-AGREE WITH RESULT CRITICAL VALUE NOTED.  VALUE IS CONSISTENT WITH PREVIOUSLY  REPORTED AND CALLED VALUE. Performed at [facility], [address] [place], [place] [address]    Culture (A)  Final    ESCHERICHIA COLI SUSCEPTIBILITIES PERFORMED ON PREVIOUS CULTURE WITHIN THE LAST 5 DAYS. Performed at [facility], [place] [address] [place], [place] [address]    Report Status 10/19/2022 FINAL  Final  Blood Culture (routine x 2)     Status: Abnormal   Collection Time: 10/15/22  6:44 AM   Specimen: BLOOD  Result Value Ref Range Status   Specimen Description   Final    BLOOD RIGHT FA Performed at [facility], [address], [place], [place] [address]    Special Requests   Final    BOTTLES DRAWN AEROBIC AND ANAEROBIC Blood Culture adequate volume Performed at [facility], [address] [place], [place] [address]    Culture  Setup Time   Final    GRAM NEGATIVE RODS IN BOTH AEROBIC AND ANAEROBIC BOTTLES CRITICAL RESULT CALLED TO, READ BACK BY AND VERIFIED WITH: PHARMD RODNEY GRUBB AT 2133 10/15/2022 GAA Organism ID to follow Performed at [facility], [address] [place], [place] [address]    Culture ESCHERICHIA COLI (A)  Final   Report Status 10/18/2022 FINAL  Final   Organism ID, Bacteria ESCHERICHIA COLI  Final      Susceptibility   Escherichia coli - MIC*    AMPICILLIN >=32 RESISTANT Resistant     CEFAZOLIN <=4 SENSITIVE Sensitive     CEFEPIME <=0.12 SENSITIVE Sensitive     CEFTAZIDIME <=1 SENSITIVE Sensitive     CEFTRIAXONE <=0.25 SENSITIVE Sensitive     CIPROFLOXACIN <=0.25 SENSITIVE Sensitive     GENTAMICIN <=1 SENSITIVE Sensitive     IMIPENEM <=0.25 SENSITIVE Sensitive     TRIMETH/SULFA >=320 RESISTANT Resistant     AMPICILLIN/SULBACTAM 16 INTERMEDIATE Intermediate     PIP/TAZO <=4 SENSITIVE Sensitive     * ESCHERICHIA COLI  Blood Culture ID Panel (Reflexed)     Status: Abnormal   Collection Time: 10/15/22  6:44 AM  Result Value Ref Range Status   Enterococcus faecalis NOT DETECTED NOT  DETECTED Final   Enterococcus Faecium NOT DETECTED NOT DETECTED Final   Listeria monocytogenes NOT DETECTED NOT DETECTED Final   Staphylococcus species NOT DETECTED NOT DETECTED Final   Staphylococcus aureus (BCID) NOT DETECTED NOT DETECTED Final   Staphylococcus epidermidis NOT DETECTED NOT DETECTED Final   Staphylococcus lugdunensis NOT DETECTED NOT DETECTED Final   Streptococcus species NOT DETECTED NOT DETECTED Final   Streptococcus agalactiae NOT DETECTED NOT DETECTED Final   Streptococcus pneumoniae NOT DETECTED NOT DETECTED Final   Streptococcus pyogenes NOT DETECTED NOT DETECTED Final   A.calcoaceticus-baumannii NOT DETECTED NOT DETECTED Final   Bacteroides fragilis NOT DETECTED NOT DETECTED Final   Enterobacterales DETECTED (A) NOT DETECTED Final    Comment: Enterobacterales represent a large order of gram negative bacteria, not a single organism. CRITICAL RESULT CALLED TO, READ BACK BY AND VERIFIED WITH: PHARMD RODNEY GRUBB AT 2133 10/15/2022 GAA    Enterobacter cloacae complex NOT DETECTED NOT DETECTED Final   Escherichia coli DETECTED (A) NOT DETECTED Final    Comment: CRITICAL RESULT CALLED TO, READ BACK BY AND VERIFIED WITH: PHARMD RODNEY GRUBB AT 2133 10/15/2022 GAA    Klebsiella aerogenes NOT DETECTED NOT DETECTED Final   Klebsiella oxytoca NOT DETECTED NOT DETECTED Final   Klebsiella pneumoniae NOT DETECTED NOT DETECTED Final   Proteus species NOT DETECTED NOT DETECTED Final   Salmonella species NOT DETECTED NOT DETECTED Final   Serratia marcescens NOT DETECTED NOT DETECTED Final   Haemophilus influenzae NOT DETECTED NOT DETECTED Final   Neisseria meningitidis NOT DETECTED NOT DETECTED Final   Pseudomonas aeruginosa NOT DETECTED NOT DETECTED Final   Stenotrophomonas maltophilia NOT DETECTED NOT DETECTED Final   Candida albicans NOT DETECTED NOT DETECTED Final   Candida auris NOT DETECTED NOT DETECTED Final   Candida glabrata NOT DETECTED NOT DETECTED Final    Candida krusei NOT DETECTED NOT DETECTED Final   Candida parapsilosis NOT DETECTED NOT DETECTED Final   Candida tropicalis NOT DETECTED NOT DETECTED Final   Cryptococcus neoformans/gattii NOT DETECTED NOT DETECTED Final   CTX-M ESBL NOT DETECTED NOT DETECTED Final   Carbapenem resistance IMP NOT DETECTED NOT DETECTED Final   Carbapenem resistance  KPC NOT DETECTED NOT DETECTED Final   Carbapenem resistance NDM NOT DETECTED NOT DETECTED Final   Carbapenem resist OXA 48 LIKE NOT DETECTED NOT DETECTED Final   Carbapenem resistance VIM NOT DETECTED NOT DETECTED Final    Comment: Performed at [facility], [address] [place], [place] [address]  SARS Coronavirus 2 by RT PCR (hospital order, performed in [facility] hospital lab) *cepheid single result test* Anterior Nasal Swab     Status: None   Collection Time: 10/15/22 11:24 AM   Specimen: Anterior Nasal Swab  Result Value Ref Range Status   SARS Coronavirus 2 by RT PCR NEGATIVE NEGATIVE Final    Comment: (NOTE) SARS-CoV-2 target nucleic acids are NOT DETECTED.  The SARS-CoV-2 RNA is generally detectable in upper and lower respiratory specimens during the acute phase of infection. The lowest concentration of SARS-CoV-2 viral copies this assay can detect is 250 copies / mL. A negative result does not preclude SARS-CoV-2 infection and should not be used as the sole basis for treatment or other patient management decisions.  A negative result may occur with improper specimen collection / handling, submission of specimen other than nasopharyngeal swab, presence of viral mutation(s) within the areas targeted by this assay, and inadequate number of viral copies (<250 copies / mL). A negative result must be combined with clinical observations, patient history, and epidemiological information.  Fact Sheet for Patients:   https://www.patel.info/  Fact Sheet for Healthcare  Providers: https://hall.com/  This test is not yet approved or  cleared by the [place] FDA and has been authorized for detection and/or diagnosis of SARS-CoV-2 by FDA under an Emergency Use Authorization (EUA).  This EUA will remain in effect (meaning this test can be used) for the duration of the COVID-19 declaration under Section 564(b)(1) of the Act, 21 U.S.C. section 360bbb-3(b)(1), unless the authorization is terminated or revoked sooner.  Performed at [facility], [address] [place], [place] [address]   Culture, blood (Routine X 2) w Reflex to ID Panel     Status: None (Preliminary result)   Collection Time: 10/18/22  8:14 AM   Specimen: BLOOD  Result Value Ref Range Status   Specimen Description BLOOD RIGHT ANTECUBITAL  Final   Special Requests   Final    BOTTLES DRAWN AEROBIC AND ANAEROBIC Blood Culture adequate volume   Culture   Final    NO GROWTH < 24 HOURS Performed at [facility], [address], [place], [place] [address]    Report Status PENDING  Incomplete  Culture, blood (Routine X 2) w Reflex to ID Panel     Status: None (Preliminary result)   Collection Time: 10/18/22  8:21 AM   Specimen: BLOOD  Result Value Ref Range Status   Specimen Description BLOOD LEFT ANTECUBITAL  Final   Special Requests   Final    BOTTLES DRAWN AEROBIC AND ANAEROBIC Blood Culture adequate volume   Culture   Final    NO GROWTH < 24 HOURS Performed at [facility], [address] [place], [place] [address]    Report Status PENDING  Incomplete     Labs: BNP (last 3 results) No results for input(s): "BNP" in the last 8760 hours. Basic Metabolic Panel: Recent Labs  Lab 10/15/22 0252 10/16/22 0142 10/17/22 0529 10/18/22 0601 10/19/22 0545  NA 136 135 133* 136 132*  K 2.2* 3.1* 3.0* 3.6 3.4*  CL 93* 99 99 99 99  CO2 _0 25  GLUCOSE 119* 108* 97 89 105*  BUN 8 <5* <5* 5* 6  CREATININE 0.61 0.59 0.49  0.56 0.51  CALCIUM 9.0 8.2* 8.0* 8.5* 8.4*  MG 1.2* 2.0  --  2.0 1.8   Liver Function Tests: Recent Labs  Lab 10/15/22 0252 10/17/22 0529 10/18/22 0601 10/19/22 0545  AST 118* 77* 62* 50*  ALT 55* 36 30 28  ALKPHOS 86 64 82 78  BILITOT 2.0* 1.1 0.9 0.6  PROT 7.7 5.6* 6.3* 6.2*  ALBUMIN 4.0 2.7* 2.9* 2.7*   Recent Labs  Lab 10/15/22 0252  LIPASE 39   No results for input(s): "AMMONIA" in the last 168 hours. CBC: Recent Labs  Lab 10/15/22 0252 10/16/22 0255 10/17/22 0529 10/18/22 0601 10/19/22 0545  WBC 19.0* 12.1* 12.9* 11.5* 12.5*  HGB 11.6* 10.8* 9.5* 10.2* 9.2*  HCT 34.6* 33.4* 29.6* 31.5* 27.8*  MCV 90.6 92.3 92.5 91.3 90.6  PLT 211 149* 173 208 275   Cardiac Enzymes: No results for input(s): "CKTOTAL", "CKMB", "CKMBINDEX", "TROPONINI" in the last 168 hours. BNP: Invalid input(s): "POCBNP" CBG: No results for input(s): "GLUCAP" in the last 168 hours. D-Dimer No results for input(s): "DDIMER" in the last 72 hours. Hgb A1c No results for input(s): "HGBA1C" in the last 72 hours. Lipid Profile No results for input(s): "CHOL", "HDL", "LDLCALC", "TRIG", "CHOLHDL", "LDLDIRECT" in the last 72 hours. Thyroid function studies No results for input(s): "TSH", "T4TOTAL", "T3FREE", "THYROIDAB" in the last 72 hours.  Invalid input(s): "FREET3" Anemia work up No results for input(s): "VITAMINB12", "FOLATE", "FERRITIN", "TIBC", "IRON", "RETICCTPCT" in the last 72 hours. Urinalysis    Component Value Date/Time   COLORURINE AMBER (A) 10/15/2022 0252   APPEARANCEUR CLOUDY (A) 10/15/2022 0252   LABSPEC 1.019 10/15/2022 0252   PHURINE 5.0 10/15/2022 0252   GLUCOSEU NEGATIVE 10/15/2022 0252   HGBUR SMALL (A) 10/15/2022 0252   BILIRUBINUR NEGATIVE 10/15/2022 0252   KETONESUR NEGATIVE 10/15/2022 0252   PROTEINUR 100 (A) 10/15/2022 0252   NITRITE POSITIVE (A) 10/15/2022 0252   LEUKOCYTESUR MODERATE (A) 10/15/2022 0252   Sepsis Labs Recent Labs  Lab 10/16/22 0255  10/17/22 0529 10/18/22 0601 10/19/22 0545  WBC 12.1* 12.9* 11.5* 12.5*   Microbiology Recent Results (from the past 240 hour(s))  Urine Culture     Status: Abnormal   Collection Time: 10/15/22  2:52 AM   Specimen: Urine, Clean Catch  Result Value Ref Range Status   Specimen Description   Final    URINE, CLEAN CATCH Performed at [facility], [address] [place], [place] [address]    Special Requests   Final    NONE Performed at [facility], [address] [place], [place] [address]    Culture >=100,000 COLONIES/mL ESCHERICHIA COLI (A)  Final   Report Status 10/17/2022 FINAL  Final   Organism ID, Bacteria ESCHERICHIA COLI (A)  Final      Susceptibility   Escherichia coli - MIC*    AMPICILLIN >=32 RESISTANT Resistant     CEFAZOLIN <=4 SENSITIVE Sensitive     CEFEPIME <=0.12 SENSITIVE Sensitive     CEFTRIAXONE <=0.25 SENSITIVE Sensitive     CIPROFLOXACIN <=0.25 SENSITIVE Sensitive     GENTAMICIN <=1 SENSITIVE Sensitive     IMIPENEM <=0.25 SENSITIVE Sensitive     NITROFURANTOIN <=16 SENSITIVE Sensitive     TRIMETH/SULFA >=320 RESISTANT Resistant     AMPICILLIN/SULBACTAM >=32 RESISTANT Resistant     PIP/TAZO <=4 SENSITIVE Sensitive     * >=100,000 COLONIES/mL ESCHERICHIA COLI  Resp Panel by RT-PCR (Flu A&B, Covid) Anterior Nasal Swab     Status: None   Collection Time: 10/15/22  2:55 AM   Specimen: Anterior Nasal Swab  Result Value Ref Range Status   SARS Coronavirus 2 by RT PCR NEGATIVE NEGATIVE Final    Comment: (NOTE) SARS-CoV-2 target nucleic acids are NOT DETECTED.  The SARS-CoV-2 RNA is generally detectable in upper respiratory specimens during the acute phase of infection. The lowest concentration of SARS-CoV-2 viral copies this assay can detect is 138 copies/mL. A negative result does not preclude SARS-Cov-2 infection and should not be used as the sole basis for treatment or other patient management decisions. A negative result may occur  with  improper specimen collection/handling, submission of specimen other than nasopharyngeal swab, presence of viral mutation(s) within the areas targeted by this assay, and inadequate number of viral copies(<138 copies/mL). A negative result must be combined with clinical observations, patient history, and epidemiological information. The expected result is Negative.  Fact Sheet for Patients:  EntrepreneurPulse.com.au  Fact Sheet for Healthcare Providers:  IncredibleEmployment.be  This test is no t yet approved or cleared by the [place] FDA and  has been authorized for detection and/or diagnosis of SARS-CoV-2 by FDA under an Emergency Use Authorization (EUA). This EUA will remain  in effect (meaning this test can be used) for the duration of the COVID-19 declaration under Section 564(b)(1) of the Act, 21 U.S.C.section 360bbb-3(b)(1), unless the authorization is terminated  or revoked sooner.       Influenza A by PCR NEGATIVE NEGATIVE Final   Influenza B by PCR NEGATIVE NEGATIVE Final    Comment: (NOTE) The Xpert Xpress SARS-CoV-2/FLU/RSV plus assay is intended as an aid in the diagnosis of influenza from Nasopharyngeal swab specimens and should not be used as a sole basis for treatment. Nasal washings and aspirates are unacceptable for Xpert Xpress SARS-CoV-2/FLU/RSV testing.  Fact Sheet for Patients: EntrepreneurPulse.com.au  Fact Sheet for Healthcare Providers: IncredibleEmployment.be  This test is not yet approved or cleared by the [place] FDA and has been authorized for detection and/or diagnosis of SARS-CoV-2 by FDA under an Emergency Use Authorization (EUA). This EUA will remain in effect (meaning this test can be used) for the duration of the COVID-19 declaration under Section 564(b)(1) of the Act, 21 U.S.C. section 360bbb-3(b)(1), unless the authorization is terminated  or revoked.  Performed at [facility], [address], [place], [place] [address]   Blood Culture (routine x 2)     Status: Abnormal   Collection Time: 10/15/22  6:44 AM   Specimen: BLOOD RIGHT ARM  Result Value Ref Range Status   Specimen Description   Final    BLOOD RIGHT ARM Performed at [facility], [address] [place], [place] [address]    Special Requests   Final    BOTTLES DRAWN AEROBIC AND ANAEROBIC Blood Culture results may not be optimal due to an excessive volume of blood received in culture bottles Performed at [facility], [address] [place], [place] [address]    Culture  Setup Time   Final    GRAM NEGATIVE RODS IN BOTH AEROBIC AND ANAEROBIC BOTTLES GRAM STAIN REVIEWED-AGREE WITH RESULT CRITICAL VALUE NOTED.  VALUE IS CONSISTENT WITH PREVIOUSLY REPORTED AND CALLED VALUE. Performed at [facility], [address] [place], [place] [address]    Culture (A)  Final    ESCHERICHIA COLI SUSCEPTIBILITIES PERFORMED ON PREVIOUS CULTURE WITHIN THE LAST 5 DAYS. Performed at  [facility], [address], [place], [place] [address]    Report Status 10/19/2022 FINAL  Final  Blood Culture (routine x 2)     Status: Abnormal   Collection Time: 10/15/22  6:44 AM   Specimen: BLOOD  Result Value Ref Range Status   Specimen Description   Final    BLOOD RIGHT FA Performed at [facility], [address] [place], [place] [address]    Special Requests   Final    BOTTLES DRAWN AEROBIC AND ANAEROBIC Blood Culture adequate volume Performed at [facility], [address] [place], [place] [address]    Culture  Setup Time   Final    GRAM NEGATIVE RODS IN BOTH AEROBIC AND ANAEROBIC BOTTLES CRITICAL RESULT CALLED TO, READ BACK BY AND VERIFIED WITH: PHARMD RODNEY GRUBB AT 2133 10/15/2022 GAA Organism ID to follow Performed at [facility], [address] [place], [place] [address]    Culture  ESCHERICHIA COLI (A)  Final   Report Status 10/18/2022 FINAL  Final   Organism ID, Bacteria ESCHERICHIA COLI  Final      Susceptibility   Escherichia coli - MIC*    AMPICILLIN >=32 RESISTANT Resistant     CEFAZOLIN <=4 SENSITIVE Sensitive     CEFEPIME <=0.12 SENSITIVE Sensitive     CEFTAZIDIME <=1 SENSITIVE Sensitive     CEFTRIAXONE <=0.25 SENSITIVE Sensitive     CIPROFLOXACIN <=0.25 SENSITIVE Sensitive     GENTAMICIN <=1 SENSITIVE Sensitive     IMIPENEM <=0.25 SENSITIVE Sensitive     TRIMETH/SULFA >=320 RESISTANT Resistant     AMPICILLIN/SULBACTAM 16 INTERMEDIATE Intermediate     PIP/TAZO <=4 SENSITIVE Sensitive     * ESCHERICHIA COLI  Blood Culture ID Panel (Reflexed)     Status: Abnormal   Collection Time: 10/15/22  6:44 AM  Result Value Ref Range Status   Enterococcus faecalis NOT DETECTED NOT DETECTED Final   Enterococcus Faecium NOT DETECTED NOT DETECTED Final   Listeria monocytogenes NOT DETECTED NOT DETECTED Final   Staphylococcus species NOT DETECTED NOT DETECTED Final   Staphylococcus aureus (BCID) NOT DETECTED NOT DETECTED Final   Staphylococcus epidermidis NOT DETECTED NOT DETECTED Final   Staphylococcus lugdunensis NOT DETECTED NOT DETECTED Final   Streptococcus species NOT DETECTED NOT DETECTED Final   Streptococcus agalactiae NOT DETECTED NOT DETECTED Final   Streptococcus pneumoniae NOT DETECTED NOT DETECTED Final   Streptococcus pyogenes NOT DETECTED NOT DETECTED Final   A.calcoaceticus-baumannii NOT DETECTED NOT DETECTED Final   Bacteroides fragilis NOT DETECTED NOT DETECTED Final   Enterobacterales DETECTED (A) NOT DETECTED Final    Comment: Enterobacterales represent a large order of gram negative bacteria, not a single organism. CRITICAL RESULT CALLED TO, READ BACK BY AND VERIFIED WITH: PHARMD RODNEY GRUBB AT 2133 10/15/2022 GAA    Enterobacter cloacae complex NOT DETECTED NOT DETECTED Final   Escherichia coli DETECTED (A) NOT DETECTED Final    Comment:  CRITICAL RESULT CALLED TO, READ BACK BY AND VERIFIED WITH: PHARMD RODNEY GRUBB AT 2133 10/15/2022 GAA    Klebsiella aerogenes NOT DETECTED NOT DETECTED Final   Klebsiella oxytoca NOT DETECTED NOT DETECTED Final   Klebsiella pneumoniae NOT DETECTED NOT DETECTED Final   Proteus species NOT DETECTED NOT DETECTED Final   Salmonella species NOT DETECTED NOT DETECTED Final   Serratia marcescens NOT DETECTED NOT DETECTED Final   Haemophilus influenzae NOT DETECTED NOT DETECTED Final   Neisseria meningitidis NOT DETECTED NOT DETECTED Final   Pseudomonas aeruginosa NOT  DETECTED NOT DETECTED Final   Stenotrophomonas maltophilia NOT DETECTED NOT DETECTED Final   Candida albicans NOT DETECTED NOT DETECTED Final   Candida auris NOT DETECTED NOT DETECTED Final   Candida glabrata NOT DETECTED NOT DETECTED Final   Candida krusei NOT DETECTED NOT DETECTED Final   Candida parapsilosis NOT DETECTED NOT DETECTED Final   Candida tropicalis NOT DETECTED NOT DETECTED Final   Cryptococcus neoformans/gattii NOT DETECTED NOT DETECTED Final   CTX-M ESBL NOT DETECTED NOT DETECTED Final   Carbapenem resistance IMP NOT DETECTED NOT DETECTED Final   Carbapenem resistance KPC NOT DETECTED NOT DETECTED Final   Carbapenem resistance NDM NOT DETECTED NOT DETECTED Final   Carbapenem resist OXA 48 LIKE NOT DETECTED NOT DETECTED Final   Carbapenem resistance VIM NOT DETECTED NOT DETECTED Final    Comment: Performed at [facility], [address] [place], [place] [address]  SARS Coronavirus 2 by RT PCR (hospital order, performed in [place] hospital lab) *cepheid single result test* Anterior Nasal Swab     Status: None   Collection Time: 10/15/22 11:24 AM   Specimen: Anterior Nasal Swab  Result Value Ref Range Status   SARS Coronavirus 2 by RT PCR NEGATIVE NEGATIVE Final    Comment: (NOTE) SARS-CoV-2 target nucleic acids are NOT DETECTED.  The SARS-CoV-2 RNA is generally detectable in upper and  lower respiratory specimens during the acute phase of infection. The lowest concentration of SARS-CoV-2 viral copies this assay can detect is 250 copies / mL. A negative result does not preclude SARS-CoV-2 infection and should not be used as the sole basis for treatment or other patient management decisions.  A negative result may occur with improper specimen collection / handling, submission of specimen other than nasopharyngeal swab, presence of viral mutation(s) within the areas targeted by this assay, and inadequate number of viral copies (<250 copies / mL). A negative result must be combined with clinical observations, patient history, and epidemiological information.  Fact Sheet for Patients:   https://www.patel.info/  Fact Sheet for Healthcare Providers: https://hall.com/  This test is not yet approved or  cleared by the [place] FDA and has been authorized for detection and/or diagnosis of SARS-CoV-2 by FDA under an Emergency Use Authorization (EUA).  This EUA will remain in effect (meaning this test can be used) for the duration of the COVID-19 declaration under Section 564(b)(1) of the Act, 21 U.S.C. section 360bbb-3(b)(1), unless the authorization is terminated or revoked sooner.  Performed at [facility], [address] [place], [place] [address]   Culture, blood (Routine X 2) w Reflex to ID Panel     Status: None (Preliminary result)   Collection Time: 10/18/22  8:14 AM   Specimen: BLOOD  Result Value Ref Range Status   Specimen Description BLOOD RIGHT ANTECUBITAL  Final   Special Requests   Final    BOTTLES DRAWN AEROBIC AND ANAEROBIC Blood Culture adequate volume   Culture   Final    NO GROWTH < 24 HOURS Performed at [facility], [address] [place], [place] [address]    Report Status PENDING  Incomplete  Culture, blood (Routine X 2) w Reflex to ID Panel     Status: None (Preliminary  result)   Collection Time: 10/18/22  8:21 AM   Specimen: BLOOD  Result Value Ref Range Status   Specimen Description BLOOD LEFT ANTECUBITAL  Final   Special Requests   Final    BOTTLES DRAWN AEROBIC AND ANAEROBIC Blood Culture  adequate volume   Culture   Final    NO GROWTH < 24 HOURS Performed at [facility], [address] [place], [place] [address]    Report Status PENDING  Incomplete     Time coordinating discharge: Over 30 minutes  SIGNED:   Wyvonnia Dusky, MD  [facility] 10/19/2022, 12:10 PM Pager   If 7PM-7AM, please contact night-coverage [email or web address] Password [email or web address]\

## 2022-10-23 LAB — CULTURE, BLOOD (ROUTINE X 2)
Culture: NO GROWTH
Culture: NO GROWTH
Special Requests: ADEQUATE
Special Requests: ADEQUATE

## 2023-04-20 ENCOUNTER — Other Ambulatory Visit: Payer: Self-pay

## 2023-04-20 ENCOUNTER — Emergency Department
Admission: EM | Admit: 2023-04-20 | Discharge: 2023-04-20 | Disposition: A | Discharge status: Home / Self Care | Payer: No Typology Code available for payment source | Attending: Emergency Medicine | Admitting: Emergency Medicine

## 2023-04-20 DIAGNOSIS — T22111A Burn of first degree of right forearm, initial encounter: Secondary | ICD-10-CM | POA: Insufficient documentation

## 2023-04-20 DIAGNOSIS — F1410 Cocaine abuse, uncomplicated: Secondary | ICD-10-CM | POA: Diagnosis present

## 2023-04-20 DIAGNOSIS — F32A Depression, unspecified: Secondary | ICD-10-CM | POA: Diagnosis not present

## 2023-04-20 DIAGNOSIS — X19XXXA Contact with other heat and hot substances, initial encounter: Secondary | ICD-10-CM | POA: Insufficient documentation

## 2023-04-20 DIAGNOSIS — Y908 Blood alcohol level of 240 mg/100 ml or more: Secondary | ICD-10-CM | POA: Diagnosis not present

## 2023-04-20 DIAGNOSIS — T300 Burn of unspecified body region, unspecified degree: Secondary | ICD-10-CM

## 2023-04-20 DIAGNOSIS — F10920 Alcohol use, unspecified with intoxication, uncomplicated: Secondary | ICD-10-CM | POA: Diagnosis present

## 2023-04-20 DIAGNOSIS — F10129 Alcohol abuse with intoxication, unspecified: Secondary | ICD-10-CM | POA: Diagnosis not present

## 2023-04-20 DIAGNOSIS — I10 Essential (primary) hypertension: Secondary | ICD-10-CM | POA: Diagnosis not present

## 2023-04-20 DIAGNOSIS — F1010 Alcohol abuse, uncomplicated: Secondary | ICD-10-CM | POA: Diagnosis present

## 2023-04-20 DIAGNOSIS — F10929 Alcohol use, unspecified with intoxication, unspecified: Secondary | ICD-10-CM | POA: Diagnosis present

## 2023-04-20 DIAGNOSIS — E876 Hypokalemia: Secondary | ICD-10-CM | POA: Insufficient documentation

## 2023-04-20 LAB — CBC
HCT: 34.4 % — ABNORMAL LOW (ref 36.0–46.0)
Hemoglobin: 11.1 g/dL — ABNORMAL LOW (ref 12.0–15.0)
MCH: 28.8 pg (ref 26.0–34.0)
MCHC: 32.3 g/dL (ref 30.0–36.0)
MCV: 89.1 fL (ref 80.0–100.0)
Platelets: 325 10*3/uL (ref 150–400)
RBC: 3.86 MIL/uL — ABNORMAL LOW (ref 3.87–5.11)
RDW: 15.9 % — ABNORMAL HIGH (ref 11.5–15.5)
WBC: 8.3 10*3/uL (ref 4.0–10.5)
nRBC: 0.2 % (ref 0.0–0.2)

## 2023-04-20 LAB — COMPREHENSIVE METABOLIC PANEL
ALT: 32 U/L (ref 0–44)
AST: 68 U/L — ABNORMAL HIGH (ref 15–41)
Albumin: 4.3 g/dL (ref 3.5–5.0)
Alkaline Phosphatase: 97 U/L (ref 38–126)
Anion gap: 19 — ABNORMAL HIGH (ref 5–15)
BUN: 10 mg/dL (ref 6–20)
CO2: 19 mmol/L — ABNORMAL LOW (ref 22–32)
Calcium: 9.4 mg/dL (ref 8.9–10.3)
Chloride: 103 mmol/L (ref 98–111)
Creatinine, Ser: 0.5 mg/dL (ref 0.44–1.00)
GFR, Estimated: >60 mL/min (ref >60)
Glucose, Bld: 89 mg/dL (ref 70–99)
Potassium: 3.1 mmol/L — ABNORMAL LOW (ref 3.5–5.1)
Sodium: 141 mmol/L (ref 135–145)
Total Bilirubin: 0.5 mg/dL (ref 0.3–1.2)
Total Protein: 8.4 g/dL — ABNORMAL HIGH (ref 6.5–8.1)

## 2023-04-20 LAB — ETHANOL: Alcohol, Ethyl (B): 395 mg/dL (ref <10)

## 2023-04-20 LAB — URINALYSIS, ROUTINE W REFLEX MICROSCOPIC
Bilirubin Urine: NEGATIVE
Glucose, UA: NEGATIVE mg/dL
Hgb urine dipstick: NEGATIVE
Ketones, ur: 20 mg/dL — AB
Nitrite: NEGATIVE
Protein, ur: 100 mg/dL — AB
Specific Gravity, Urine: 1.024 (ref 1.005–1.030)
pH: 5 (ref 5.0–8.0)

## 2023-04-20 LAB — ACETAMINOPHEN LEVEL: Acetaminophen (Tylenol), Serum: <10 ug/mL — ABNORMAL LOW (ref 10–30)

## 2023-04-20 LAB — URINE DRUG SCREEN, QUALITATIVE (ARMC ONLY)
Amphetamines, Ur Screen: NOT DETECTED
Barbiturates, Ur Screen: NOT DETECTED
Benzodiazepine, Ur Scrn: POSITIVE — AB
Cannabinoid 50 Ng, Ur ~~LOC~~: POSITIVE — AB
Cocaine Metabolite,Ur ~~LOC~~: POSITIVE — AB
MDMA (Ecstasy)Ur Screen: NOT DETECTED
Methadone Scn, Ur: NOT DETECTED
Opiate, Ur Screen: NOT DETECTED
Phencyclidine (PCP) Ur S: NOT DETECTED
Tricyclic, Ur Screen: POSITIVE — AB

## 2023-04-20 LAB — POC URINE PREG, ED: Preg Test, Ur: NEGATIVE

## 2023-04-20 LAB — SALICYLATE LEVEL: Salicylate Lvl: <7 mg/dL — ABNORMAL LOW (ref 7.0–30.0)

## 2023-04-20 MED ORDER — ONDANSETRON 4 MG PO TBDP
4.0000 mg | ORAL_TABLET | Freq: Four times a day (QID) | ORAL | Status: DC | PRN
  Administered 2023-04-20: 4 mg via ORAL
  Filled 2023-04-20: qty 1

## 2023-04-20 MED ORDER — HYDROXYZINE HCL 25 MG PO TABS: 25.0000 mg | ORAL_TABLET | Freq: Four times a day (QID) | ORAL | Status: DC | PRN

## 2023-04-20 MED ORDER — LORAZEPAM 2 MG PO TABS
0.0000 mg | ORAL_TABLET | Freq: Four times a day (QID) | ORAL | Status: DC
  Administered 2023-04-20: 1 mg via ORAL
  Filled 2023-04-20: qty 1

## 2023-04-20 MED ORDER — LORAZEPAM 1 MG PO TABS: 1.0000 mg | ORAL_TABLET | Freq: Three times a day (TID) | ORAL | Status: DC

## 2023-04-20 MED ORDER — ACETAMINOPHEN 325 MG PO TABS
650.0000 mg | ORAL_TABLET | Freq: Once | ORAL | Status: AC
  Administered 2023-04-20: 650 mg via ORAL
  Filled 2023-04-20: qty 2

## 2023-04-20 MED ORDER — THIAMINE HCL 100 MG PO TABS
100.0000 mg | ORAL_TABLET | Freq: Every day | ORAL | Status: DC
  Filled 2023-04-20: qty 1

## 2023-04-20 MED ORDER — LORAZEPAM 1 MG PO TABS: 1.0000 mg | ORAL_TABLET | Freq: Two times a day (BID) | ORAL | Status: DC

## 2023-04-20 MED ORDER — LOPERAMIDE HCL 2 MG PO CAPS: 2.0000 mg | ORAL_CAPSULE | ORAL | Status: DC | PRN

## 2023-04-20 MED ORDER — LORAZEPAM 1 MG PO TABS: 1.0000 mg | ORAL_TABLET | Freq: Every day | ORAL | Status: DC

## 2023-04-20 MED ORDER — ADULT MULTIVITAMIN W/MINERALS CH
1.0000 | ORAL_TABLET | Freq: Every day | ORAL | Status: DC
  Administered 2023-04-20: 1 via ORAL
  Filled 2023-04-20: qty 1

## 2023-04-20 MED ORDER — SILVER SULFADIAZINE 1 % EX CREA
TOPICAL_CREAM | Freq: Once | CUTANEOUS | Status: AC
  Filled 2023-04-20: qty 85

## 2023-04-20 MED ORDER — LORAZEPAM 1 MG PO TABS
1.0000 mg | ORAL_TABLET | Freq: Four times a day (QID) | ORAL | Status: DC
  Administered 2023-04-20 (×2): 1 mg via ORAL
  Filled 2023-04-20 (×2): qty 1

## 2023-04-20 MED ORDER — POTASSIUM CHLORIDE CRYS ER 20 MEQ PO TBCR
40.0000 meq | EXTENDED_RELEASE_TABLET | Freq: Once | ORAL | Status: AC
  Administered 2023-04-20: 40 meq via ORAL
  Filled 2023-04-20: qty 2

## 2023-04-20 MED ORDER — THIAMINE MONONITRATE 100 MG PO TABS
100.0000 mg | ORAL_TABLET | Freq: Every day | ORAL | Status: DC
  Administered 2023-04-20: 100 mg via ORAL
  Filled 2023-04-20: qty 1

## 2023-04-20 MED ORDER — LORAZEPAM 1 MG PO TABS: 1.0000 mg | ORAL_TABLET | Freq: Four times a day (QID) | ORAL | Status: DC | PRN
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
## 2023-04-20 NOTE — ED Notes (Signed)
BREAKFAST TRAY GIVEN 

## 2023-04-20 NOTE — ED Notes (Signed)
Pt c/o nausea - prn ondansetron given
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
## 2023-04-20 NOTE — ED Notes (Signed)
Patient speaking with TTS.  

## 2023-04-20 NOTE — ED Notes (Signed)
IVC/pending psych consult 
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
## 2023-04-20 NOTE — ED Notes (Signed)
Pt given her belongings
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
## 2023-04-20 NOTE — ED Provider Notes (Signed)
-----------------------------------------   2:28 PM on 04/20/2023 -----------------------------------------  Patient has been evaluated by psychiatry NP Shaune Pollack.  She has rescinded the IVC.  There is no recommendation for inpatient admission or further ED management.  The patient is alert and clinically sober at this time.  She is stable for discharge.  Return precautions and outpatient resources provided.   Dionne Bucy, MD 04/20/23 1429
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
## 2023-04-20 NOTE — ED Notes (Signed)
Area to right forearm and 2nd and 3rd fingers of right hand cleansed and silvadene cream applied.  Gauze wrapping in place.

## 2023-04-20 NOTE — ED Notes (Signed)
Patient is IVC pending placement when medically cleared 
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
## 2023-05-21 DIAGNOSIS — Z789 Other specified health status: Secondary | ICD-10-CM | POA: Diagnosis not present

## 2023-05-21 DIAGNOSIS — Z113 Encounter for screening for infections with a predominantly sexual mode of transmission: Secondary | ICD-10-CM | POA: Diagnosis not present

## 2023-05-21 DIAGNOSIS — R197 Diarrhea, unspecified: Secondary | ICD-10-CM | POA: Diagnosis not present

## 2023-05-21 DIAGNOSIS — R399 Unspecified symptoms and signs involving the genitourinary system: Secondary | ICD-10-CM | POA: Diagnosis not present

## 2023-05-21 DIAGNOSIS — F419 Anxiety disorder, unspecified: Secondary | ICD-10-CM | POA: Diagnosis not present

## 2023-06-12 ENCOUNTER — Other Ambulatory Visit: Payer: Self-pay

## 2023-06-12 ENCOUNTER — Inpatient Hospital Stay
Admission: EM | Admit: 2023-06-12 | Discharge: 2023-06-16 | DRG: 440 | Disposition: A | Discharge status: Home / Self Care | Payer: 59 | Attending: Internal Medicine | Admitting: Internal Medicine

## 2023-06-12 ENCOUNTER — Emergency Department: Payer: 59

## 2023-06-12 DIAGNOSIS — K760 Fatty (change of) liver, not elsewhere classified: Secondary | ICD-10-CM | POA: Diagnosis present

## 2023-06-12 DIAGNOSIS — Z91018 Allergy to other foods: Secondary | ICD-10-CM

## 2023-06-12 DIAGNOSIS — R1010 Upper abdominal pain, unspecified: Secondary | ICD-10-CM

## 2023-06-12 DIAGNOSIS — E860 Dehydration: Secondary | ICD-10-CM | POA: Diagnosis not present

## 2023-06-12 DIAGNOSIS — R0789 Other chest pain: Secondary | ICD-10-CM | POA: Diagnosis not present

## 2023-06-12 DIAGNOSIS — E876 Hypokalemia: Secondary | ICD-10-CM | POA: Diagnosis present

## 2023-06-12 DIAGNOSIS — K852 Alcohol induced acute pancreatitis without necrosis or infection: Secondary | ICD-10-CM | POA: Diagnosis not present

## 2023-06-12 DIAGNOSIS — E8729 Other acidosis: Secondary | ICD-10-CM

## 2023-06-12 DIAGNOSIS — R112 Nausea with vomiting, unspecified: Secondary | ICD-10-CM

## 2023-06-12 DIAGNOSIS — K859 Acute pancreatitis without necrosis or infection, unspecified: Secondary | ICD-10-CM | POA: Diagnosis not present

## 2023-06-12 DIAGNOSIS — R079 Chest pain, unspecified: Secondary | ICD-10-CM | POA: Diagnosis not present

## 2023-06-12 DIAGNOSIS — Z9102 Food additives allergy status: Secondary | ICD-10-CM

## 2023-06-12 DIAGNOSIS — F1910 Other psychoactive substance abuse, uncomplicated: Secondary | ICD-10-CM | POA: Insufficient documentation

## 2023-06-12 DIAGNOSIS — F17210 Nicotine dependence, cigarettes, uncomplicated: Secondary | ICD-10-CM | POA: Diagnosis present

## 2023-06-12 DIAGNOSIS — G43909 Migraine, unspecified, not intractable, without status migrainosus: Secondary | ICD-10-CM | POA: Diagnosis present

## 2023-06-12 DIAGNOSIS — R7989 Other specified abnormal findings of blood chemistry: Secondary | ICD-10-CM

## 2023-06-12 DIAGNOSIS — K7010 Alcoholic hepatitis without ascites: Secondary | ICD-10-CM | POA: Diagnosis present

## 2023-06-12 DIAGNOSIS — F1010 Alcohol abuse, uncomplicated: Secondary | ICD-10-CM | POA: Diagnosis present

## 2023-06-12 DIAGNOSIS — D649 Anemia, unspecified: Secondary | ICD-10-CM | POA: Diagnosis present

## 2023-06-12 DIAGNOSIS — F32A Depression, unspecified: Secondary | ICD-10-CM | POA: Insufficient documentation

## 2023-06-12 DIAGNOSIS — Z8249 Family history of ischemic heart disease and other diseases of the circulatory system: Secondary | ICD-10-CM

## 2023-06-12 DIAGNOSIS — I10 Essential (primary) hypertension: Secondary | ICD-10-CM | POA: Insufficient documentation

## 2023-06-12 DIAGNOSIS — F10129 Alcohol abuse with intoxication, unspecified: Secondary | ICD-10-CM | POA: Diagnosis not present

## 2023-06-12 DIAGNOSIS — Z79899 Other long term (current) drug therapy: Secondary | ICD-10-CM

## 2023-06-12 DIAGNOSIS — F909 Attention-deficit hyperactivity disorder, unspecified type: Secondary | ICD-10-CM | POA: Diagnosis present

## 2023-06-12 LAB — CBC WITH DIFFERENTIAL/PLATELET
Abs Immature Granulocytes: 0.05 10*3/uL (ref 0.00–0.07)
Basophils Absolute: 0 10*3/uL (ref 0.0–0.1)
Basophils Relative: 0 %
Eosinophils Absolute: 0 10*3/uL (ref 0.0–0.5)
Eosinophils Relative: 0 %
HCT: 34.5 % — ABNORMAL LOW (ref 36.0–46.0)
Hemoglobin: 10 g/dL — ABNORMAL LOW (ref 12.0–15.0)
Immature Granulocytes: 1 %
Lymphocytes Relative: 5 %
Lymphs Abs: 0.5 10*3/uL — ABNORMAL LOW (ref 0.7–4.0)
MCH: 26.2 pg (ref 26.0–34.0)
MCHC: 29 g/dL — ABNORMAL LOW (ref 30.0–36.0)
MCV: 90.3 fL (ref 80.0–100.0)
Monocytes Absolute: 0.6 10*3/uL (ref 0.1–1.0)
Monocytes Relative: 6 %
Neutro Abs: 9.4 10*3/uL — ABNORMAL HIGH (ref 1.7–7.7)
Neutrophils Relative %: 88 %
Platelets: 225 10*3/uL (ref 150–400)
RBC: 3.82 MIL/uL — ABNORMAL LOW (ref 3.87–5.11)
RDW: 16.1 % — ABNORMAL HIGH (ref 11.5–15.5)
WBC: 10.5 10*3/uL (ref 4.0–10.5)
nRBC: 0 % (ref 0.0–0.2)

## 2023-06-12 LAB — COMPREHENSIVE METABOLIC PANEL
ALT: 77 U/L — ABNORMAL HIGH (ref 0–44)
AST: 127 U/L — ABNORMAL HIGH (ref 15–41)
Albumin: 5.2 g/dL — ABNORMAL HIGH (ref 3.5–5.0)
Alkaline Phosphatase: 103 U/L (ref 38–126)
Anion gap: 27 — ABNORMAL HIGH (ref 5–15)
BUN: 7 mg/dL (ref 6–20)
CO2: 11 mmol/L — ABNORMAL LOW (ref 22–32)
Calcium: 8.9 mg/dL (ref 8.9–10.3)
Chloride: 97 mmol/L — ABNORMAL LOW (ref 98–111)
Creatinine, Ser: 0.89 mg/dL (ref 0.44–1.00)
GFR, Estimated: >60 mL/min (ref >60)
Glucose, Bld: 99 mg/dL (ref 70–99)
Potassium: 3.5 mmol/L (ref 3.5–5.1)
Sodium: 135 mmol/L (ref 135–145)
Total Bilirubin: 2.5 mg/dL — ABNORMAL HIGH (ref 0.3–1.2)
Total Protein: 8.7 g/dL — ABNORMAL HIGH (ref 6.5–8.1)

## 2023-06-12 LAB — TROPONIN I (HIGH SENSITIVITY): Troponin I (High Sensitivity): 2 ng/L (ref <18)

## 2023-06-12 LAB — LIPASE, BLOOD: Lipase: 228 U/L — ABNORMAL HIGH (ref 11–51)

## 2023-06-12 LAB — POC URINE PREG, ED: Preg Test, Ur: NEGATIVE
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
## 2023-06-12 NOTE — ED Triage Notes (Signed)
Pt to ED via POV c/o CP and vomiting. Pt reports vomiting since last night, hasn't been bale to keep anything down. Pt developed CP today around 12pm. Pt describes pain as pressure in the middle of her chest. Pt also endorses some LLQ abd pain that started today. Denies any SOB, fevers, dizziness.

## 2023-06-13 ENCOUNTER — Emergency Department: Payer: 59

## 2023-06-13 DIAGNOSIS — K859 Acute pancreatitis without necrosis or infection, unspecified: Secondary | ICD-10-CM | POA: Diagnosis present

## 2023-06-13 DIAGNOSIS — R0789 Other chest pain: Secondary | ICD-10-CM | POA: Diagnosis not present

## 2023-06-13 DIAGNOSIS — R7989 Other specified abnormal findings of blood chemistry: Secondary | ICD-10-CM

## 2023-06-13 DIAGNOSIS — D649 Anemia, unspecified: Secondary | ICD-10-CM | POA: Diagnosis not present

## 2023-06-13 DIAGNOSIS — K760 Fatty (change of) liver, not elsewhere classified: Secondary | ICD-10-CM | POA: Diagnosis not present

## 2023-06-13 DIAGNOSIS — K852 Alcohol induced acute pancreatitis without necrosis or infection: Secondary | ICD-10-CM | POA: Diagnosis not present

## 2023-06-13 DIAGNOSIS — F909 Attention-deficit hyperactivity disorder, unspecified type: Secondary | ICD-10-CM | POA: Diagnosis present

## 2023-06-13 DIAGNOSIS — F1010 Alcohol abuse, uncomplicated: Secondary | ICD-10-CM | POA: Diagnosis not present

## 2023-06-13 DIAGNOSIS — R188 Other ascites: Secondary | ICD-10-CM | POA: Diagnosis not present

## 2023-06-13 DIAGNOSIS — Z91018 Allergy to other foods: Secondary | ICD-10-CM | POA: Diagnosis not present

## 2023-06-13 DIAGNOSIS — F32A Depression, unspecified: Secondary | ICD-10-CM

## 2023-06-13 DIAGNOSIS — I10 Essential (primary) hypertension: Secondary | ICD-10-CM | POA: Diagnosis not present

## 2023-06-13 DIAGNOSIS — F17200 Nicotine dependence, unspecified, uncomplicated: Secondary | ICD-10-CM | POA: Diagnosis not present

## 2023-06-13 DIAGNOSIS — E860 Dehydration: Secondary | ICD-10-CM | POA: Diagnosis present

## 2023-06-13 DIAGNOSIS — F10231 Alcohol dependence with withdrawal delirium: Secondary | ICD-10-CM | POA: Diagnosis not present

## 2023-06-13 DIAGNOSIS — G43909 Migraine, unspecified, not intractable, without status migrainosus: Secondary | ICD-10-CM | POA: Diagnosis present

## 2023-06-13 DIAGNOSIS — E871 Hypo-osmolality and hyponatremia: Secondary | ICD-10-CM | POA: Diagnosis not present

## 2023-06-13 DIAGNOSIS — Z8249 Family history of ischemic heart disease and other diseases of the circulatory system: Secondary | ICD-10-CM | POA: Diagnosis not present

## 2023-06-13 DIAGNOSIS — F17210 Nicotine dependence, cigarettes, uncomplicated: Secondary | ICD-10-CM | POA: Diagnosis not present

## 2023-06-13 DIAGNOSIS — Z9102 Food additives allergy status: Secondary | ICD-10-CM | POA: Diagnosis not present

## 2023-06-13 DIAGNOSIS — R569 Unspecified convulsions: Secondary | ICD-10-CM | POA: Diagnosis present

## 2023-06-13 DIAGNOSIS — F1910 Other psychoactive substance abuse, uncomplicated: Secondary | ICD-10-CM | POA: Insufficient documentation

## 2023-06-13 DIAGNOSIS — K7010 Alcoholic hepatitis without ascites: Secondary | ICD-10-CM | POA: Diagnosis not present

## 2023-06-13 DIAGNOSIS — R109 Unspecified abdominal pain: Secondary | ICD-10-CM | POA: Diagnosis not present

## 2023-06-13 DIAGNOSIS — R160 Hepatomegaly, not elsewhere classified: Secondary | ICD-10-CM | POA: Diagnosis not present

## 2023-06-13 DIAGNOSIS — E876 Hypokalemia: Secondary | ICD-10-CM | POA: Diagnosis present

## 2023-06-13 DIAGNOSIS — Z79899 Other long term (current) drug therapy: Secondary | ICD-10-CM | POA: Diagnosis not present

## 2023-06-13 LAB — URINE DRUG SCREEN, QUALITATIVE (ARMC ONLY)
Amphetamines, Ur Screen: NOT DETECTED
Barbiturates, Ur Screen: NOT DETECTED
Benzodiazepine, Ur Scrn: NOT DETECTED
Cannabinoid 50 Ng, Ur ~~LOC~~: POSITIVE — AB
Cocaine Metabolite,Ur ~~LOC~~: NOT DETECTED
MDMA (Ecstasy)Ur Screen: NOT DETECTED
Methadone Scn, Ur: NOT DETECTED
Opiate, Ur Screen: POSITIVE — AB
Phencyclidine (PCP) Ur S: NOT DETECTED
Tricyclic, Ur Screen: NOT DETECTED

## 2023-06-13 LAB — CBC
HCT: 30.3 % — ABNORMAL LOW (ref 36.0–46.0)
Hemoglobin: 9 g/dL — ABNORMAL LOW (ref 12.0–15.0)
MCH: 26.3 pg (ref 26.0–34.0)
MCHC: 29.7 g/dL — ABNORMAL LOW (ref 30.0–36.0)
MCV: 88.6 fL (ref 80.0–100.0)
Platelets: 200 10*3/uL (ref 150–400)
RBC: 3.42 MIL/uL — ABNORMAL LOW (ref 3.87–5.11)
RDW: 15.9 % — ABNORMAL HIGH (ref 11.5–15.5)
WBC: 8.4 10*3/uL (ref 4.0–10.5)
nRBC: 0.2 % (ref 0.0–0.2)

## 2023-06-13 LAB — COMPREHENSIVE METABOLIC PANEL
ALT: 64 U/L — ABNORMAL HIGH (ref 0–44)
AST: 90 U/L — ABNORMAL HIGH (ref 15–41)
Albumin: 4.6 g/dL (ref 3.5–5.0)
Alkaline Phosphatase: 93 U/L (ref 38–126)
Anion gap: 15 (ref 5–15)
BUN: 6 mg/dL (ref 6–20)
CO2: 17 mmol/L — ABNORMAL LOW (ref 22–32)
Calcium: 8.6 mg/dL — ABNORMAL LOW (ref 8.9–10.3)
Chloride: 101 mmol/L (ref 98–111)
Creatinine, Ser: 0.68 mg/dL (ref 0.44–1.00)
GFR, Estimated: >60 mL/min (ref >60)
Glucose, Bld: 151 mg/dL — ABNORMAL HIGH (ref 70–99)
Potassium: 3.5 mmol/L (ref 3.5–5.1)
Sodium: 133 mmol/L — ABNORMAL LOW (ref 135–145)
Total Bilirubin: 2.5 mg/dL — ABNORMAL HIGH (ref 0.3–1.2)
Total Protein: 7.7 g/dL (ref 6.5–8.1)

## 2023-06-13 LAB — URINALYSIS, ROUTINE W REFLEX MICROSCOPIC
Bacteria, UA: NONE SEEN
Bilirubin Urine: NEGATIVE
Glucose, UA: NEGATIVE mg/dL
Hgb urine dipstick: NEGATIVE
Ketones, ur: 80 mg/dL — AB
Leukocytes,Ua: NEGATIVE
Nitrite: NEGATIVE
Protein, ur: 100 mg/dL — AB
Specific Gravity, Urine: >1.046 — ABNORMAL HIGH (ref 1.005–1.030)
pH: 5 (ref 5.0–8.0)

## 2023-06-13 LAB — GLUCOSE, CAPILLARY: Glucose-Capillary: 102 mg/dL — ABNORMAL HIGH (ref 70–99)

## 2023-06-13 LAB — ETHANOL: Alcohol, Ethyl (B): <10 mg/dL

## 2023-06-13 LAB — TROPONIN I (HIGH SENSITIVITY): Troponin I (High Sensitivity): 3 ng/L (ref <18)

## 2023-06-13 LAB — LIPASE, BLOOD: Lipase: 434 U/L — ABNORMAL HIGH (ref 11–51)

## 2023-06-13 MED ORDER — LIDOCAINE VISCOUS HCL 2 % MT SOLN
15.0000 mL | OROMUCOSAL | Status: DC | PRN
  Administered 2023-06-13 (×2): 15 mL via ORAL
  Filled 2023-06-13 (×2): qty 15

## 2023-06-13 MED ORDER — MORPHINE SULFATE (PF) 2 MG/ML IV SOLN
2.0000 mg | INTRAVENOUS | Status: DC | PRN
  Administered 2023-06-13 – 2023-06-16 (×12): 2 mg via INTRAVENOUS
  Filled 2023-06-13 (×13): qty 1

## 2023-06-13 MED ORDER — TRAZODONE HCL 50 MG PO TABS
25.0000 mg | ORAL_TABLET | Freq: Every evening | ORAL | Status: DC | PRN
  Administered 2023-06-13 – 2023-06-15 (×3): 25 mg via ORAL
  Filled 2023-06-13 (×3): qty 1

## 2023-06-13 MED ORDER — LACTATED RINGERS IV SOLN: INTRAVENOUS | Status: DC

## 2023-06-13 MED ORDER — POTASSIUM CHLORIDE IN NACL 20-0.9 MEQ/L-% IV SOLN
INTRAVENOUS | Status: DC
  Filled 2023-06-13: qty 1000

## 2023-06-13 MED ORDER — ALUM & MAG HYDROXIDE-SIMETH 200-200-20 MG/5ML PO SUSP
30.0000 mL | ORAL | Status: DC | PRN
  Administered 2023-06-13 – 2023-06-15 (×4): 30 mL via ORAL
  Filled 2023-06-13 (×4): qty 30

## 2023-06-13 MED ORDER — FAMOTIDINE IN NACL 20-0.9 MG/50ML-% IV SOLN
20.0000 mg | Freq: Once | INTRAVENOUS | Status: AC
  Administered 2023-06-13: 20 mg via INTRAVENOUS
  Filled 2023-06-13: qty 50

## 2023-06-13 MED ORDER — ONDANSETRON HCL 4 MG/2ML IJ SOLN
4.0000 mg | Freq: Once | INTRAMUSCULAR | Status: AC
  Administered 2023-06-13: 4 mg via INTRAVENOUS
  Filled 2023-06-13: qty 2

## 2023-06-13 MED ORDER — LORAZEPAM 2 MG/ML IJ SOLN
0.0000 mg | Freq: Four times a day (QID) | INTRAMUSCULAR | Status: AC
  Administered 2023-06-13 (×2): 1 mg via INTRAVENOUS
  Administered 2023-06-13: 2 mg via INTRAVENOUS
  Administered 2023-06-14: 1 mg via INTRAVENOUS
  Administered 2023-06-14: 2 mg via INTRAVENOUS
  Administered 2023-06-14: 1 mg via INTRAVENOUS
  Filled 2023-06-13 (×6): qty 1

## 2023-06-13 MED ORDER — ACETAMINOPHEN 650 MG RE SUPP: 650.0000 mg | Freq: Four times a day (QID) | RECTAL | Status: DC | PRN

## 2023-06-13 MED ORDER — ONDANSETRON HCL 4 MG/2ML IJ SOLN
4.0000 mg | Freq: Four times a day (QID) | INTRAMUSCULAR | Status: DC | PRN
  Administered 2023-06-13 – 2023-06-14 (×2): 4 mg via INTRAVENOUS
  Filled 2023-06-13 (×2): qty 2

## 2023-06-13 MED ORDER — ENOXAPARIN SODIUM 40 MG/0.4ML IJ SOSY
40.0000 mg | PREFILLED_SYRINGE | INTRAMUSCULAR | Status: DC
  Administered 2023-06-13 – 2023-06-16 (×3): 40 mg via SUBCUTANEOUS
  Filled 2023-06-13 (×4): qty 0.4

## 2023-06-13 MED ORDER — AMLODIPINE BESYLATE 5 MG PO TABS
5.0000 mg | ORAL_TABLET | Freq: Every day | ORAL | Status: DC
  Administered 2023-06-14 – 2023-06-15 (×2): 5 mg via ORAL
  Filled 2023-06-13 (×2): qty 1

## 2023-06-13 MED ORDER — ONDANSETRON HCL 4 MG PO TABS: 4.0000 mg | ORAL_TABLET | Freq: Four times a day (QID) | ORAL | Status: DC | PRN

## 2023-06-13 MED ORDER — PANTOPRAZOLE SODIUM 40 MG PO TBEC
40.0000 mg | DELAYED_RELEASE_TABLET | Freq: Every day | ORAL | Status: DC
  Administered 2023-06-13 – 2023-06-16 (×4): 40 mg via ORAL
  Filled 2023-06-13 (×4): qty 1

## 2023-06-13 MED ORDER — LIDOCAINE VISCOUS HCL 2 % MT SOLN
15.0000 mL | Freq: Once | OROMUCOSAL | Status: AC
  Administered 2023-06-13: 15 mL via OROMUCOSAL
  Filled 2023-06-13: qty 15

## 2023-06-13 MED ORDER — HYDROMORPHONE HCL 1 MG/ML IJ SOLN
0.5000 mg | Freq: Once | INTRAMUSCULAR | Status: AC
  Administered 2023-06-13: 0.5 mg via INTRAVENOUS
  Filled 2023-06-13: qty 0.5

## 2023-06-13 MED ORDER — SODIUM CHLORIDE 0.9 % IV BOLUS
1000.0000 mL | Freq: Once | INTRAVENOUS | Status: AC
  Administered 2023-06-13: 1000 mL via INTRAVENOUS

## 2023-06-13 MED ORDER — THIAMINE HCL 100 MG PO TABS
100.0000 mg | ORAL_TABLET | Freq: Every day | ORAL | Status: DC
  Administered 2023-06-13 – 2023-06-16 (×4): 100 mg via ORAL
  Filled 2023-06-13 (×8): qty 1

## 2023-06-13 MED ORDER — ACETAMINOPHEN 325 MG PO TABS
650.0000 mg | ORAL_TABLET | Freq: Four times a day (QID) | ORAL | Status: DC | PRN
  Administered 2023-06-13 – 2023-06-15 (×3): 650 mg via ORAL
  Filled 2023-06-13 (×4): qty 2

## 2023-06-13 MED ORDER — FOLIC ACID 1 MG PO TABS
1.0000 mg | ORAL_TABLET | Freq: Every day | ORAL | Status: DC
  Administered 2023-06-13 – 2023-06-16 (×4): 1 mg via ORAL
  Filled 2023-06-13 (×4): qty 1

## 2023-06-13 MED ORDER — IOHEXOL 300 MG/ML  SOLN
100.0000 mL | Freq: Once | INTRAMUSCULAR | Status: AC | PRN
  Administered 2023-06-13: 100 mL via INTRAVENOUS

## 2023-06-13 MED ORDER — MAGNESIUM HYDROXIDE 400 MG/5ML PO SUSP
30.0000 mL | Freq: Every day | ORAL | Status: DC | PRN
  Administered 2023-06-14: 30 mL via ORAL
  Filled 2023-06-13 (×2): qty 30

## 2023-06-13 MED ORDER — ALUM & MAG HYDROXIDE-SIMETH 200-200-20 MG/5ML PO SUSP
30.0000 mL | Freq: Once | ORAL | Status: AC
  Administered 2023-06-13: 30 mL via ORAL
  Filled 2023-06-13: qty 30

## 2023-06-13 MED ORDER — POTASSIUM CHLORIDE IN NACL 20-0.9 MEQ/L-% IV SOLN: INTRAVENOUS | Status: DC

## 2023-06-13 NOTE — ED Notes (Signed)
Pt removed self from monitoring cords.

## 2023-06-13 NOTE — Progress Notes (Signed)
Patient seen and examined personally, I reviewed the chart, history and physical and admission note, done by admitting physician this morning and agree with the same with following addendum.  Please refer to the morning admission note for more detailed plan of care.  Briefly,  37 y.o.f w/ history of ADHD, hypertension, alcohol abuse and dependence with polysubstance abuse including tobacco and marijuana presented with complaint of epigastric, left upper quadrant abdominal pain for the last day with nausea and vomiting Her last alcoholic drink was a couple of days ago.  Also complained of dark stool, occasional bleeding from her hemorrhoids, mild diarrhea,No report of fever, chills   In ED: Vitals stable, labs lipase 228 AST 127 ALT 77, TB 2.5 troponin 2, 3, CBC with normocytic anemia hemoglobin 10 g  Urine pregnancy test was negative.  UA showed with 80 ketones and was otherwise unremarkable.  Alcohol level <10. Urine drug screen was positive for cannabinoid and opiates. EKG : Normal sinus rhythm with a rate of 77 Imaging: CT abdominal pelvis with contrast:acute pancreatitis, hepatic steatosis with no pseudocyst. Patient admitted for further management.   She reports similar pain Some nausea, but not in distress appears comfortable Abdomen is soft mildly tender Willing to try clear liquid diet  A/P Acute pancreatitis likely alcoholic pancreatitis Abnormal LFTs likely alcohol induced: Continue on current supportive care with adequate hydration with pain management, antiemetics, start clear liquid diet and advance as tolerated.  On HCTZ at home.  For now, trend LFTs resulting downtrending.  CT imaging shows hepatic steatosis no gallstone, no gallbladder wall thickening and no biliary dilatation.  Alcohol abuse at risk of withdrawal: Cessation counseling TOC consult, CIWA scale Ativan thiamine folate vitamins will be continued monitor for withdrawals.  Discussed about alcohol cessation and  length  Depression: Continue Zoloft  Essential hypertension: HCTZ stopped due to 1 and started on Norvasc  Normocytic anemia: appears chronic, monitor

## 2023-06-13 NOTE — Assessment & Plan Note (Addendum)
-   This is likely acute alcoholic pancreatitis. - We will stop her HCTZ. - The patient will be admitted to a medical bed. - Will be kept NPO. - Will be hydrated with IV normal saline. - Pain management will be provided.

## 2023-06-13 NOTE — ED Notes (Signed)
First encounter with pt. Pt calling on call bell to ask for monitor to be silenced. Monitor is not beeping. Explained to pt that phillips monitor makes a sound when it is on and there is no way to stop this sound except by turning off monitor. Pt had removed BP cuff. Placed cuff back on arm. Pt is alert, oriented and has unlabored respirations.
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
## 2023-06-13 NOTE — Hospital Course (Addendum)
36 y.o.f w/ history of ADHD, hypertension, alcohol abuse and dependence with polysubstance abuse including tobacco and marijuana presented with complaint of epigastric, left upper quadrant abdominal pain for the last day with nausea and vomiting Her last alcoholic drink was a couple of days ago.  Also complained of dark stool, occasional bleeding from her hemorrhoids, mild diarrhea,No report of fever, chills   In ED: Vitals stable, labs lipase 228 AST 127 ALT 77, TB 2.5 troponin 2, 3, CBC with normocytic anemia hemoglobin 10 g  Urine pregnancy test was negative.  UA showed with 80 ketones and was otherwise unremarkable.  Alcohol level <10. Urine drug screen was positive for cannabinoid and opiates. EKG : Normal sinus rhythm with a rate of 77 Imaging: CT abdominal pelvis with contrast:acute pancreatitis, hepatic steatosis with no pseudocyst. Patient admitted for further management. Continue on aggressive IV fluid hydration pain management, initially n.p.o. changed to clear liquid diet and slowly advanced At this time she is tolerating diet alert awake oriented x 4 pain controlled.  We extensively discussed about alcohol cessation

## 2023-06-13 NOTE — Assessment & Plan Note (Signed)
-   This could be related to mild alcoholic hepatitis. - We will follow LFTs with hydration.

## 2023-06-13 NOTE — H&P (Addendum)
[place]   PATIENT NAME: Gina Rich    MR#:  [id number]  DATE OF BIRTH:  1986/11/02  DATE OF ADMISSION:  06/12/2023  PRIMARY CARE PHYSICIAN: Jenell Milliner, MD   Patient is coming from: Home  REQUESTING/REFERRING PHYSICIAN: Chiquita Loth, MD  CHIEF COMPLAINT:   Chief Complaint  Patient presents with   Chest Pain   Emesis    HISTORY OF PRESENT ILLNESS:  Gina Rich is a 37 y.o. female with medical history significant for ADHD, hypertension, alcohol abuse and dependence with polysubstance abuse including tobacco and marijuana.FEMALEPHYSICAL , who presented to the emergency room with acute onset of epigastric and left upper quadrant abdominal pain for the last day and a half with associated nausea and vomiting.  Her last alcoholic drink was a couple of days ago.  She denies any fever or chills.  She stated that her stools were dark.  She has occasional bleeding from her hemorrhoids.  She admits to mild diarrhea.  No headache or dizziness or blurred vision.  No dysuria, oliguria or hematuria or flank pain.  No chest pain or palpitations.  No cough or wheezing or hemoptysis.  No other bleeding diathesis.  ED Course: When she came to the ER, vital signs were within normal.  Labs revealed hypokalemia 97 anion gap of 27 with albumin of 5.2 and 215 8.7 AST 127 ALT 77 total bili 2.5 with serum lipase of 228.  CBC showed anemia close to baseline.  Urine pregnancy test was negative.  UA showed specific gravity more than 1046 and 100 protein with 80 ketones and was otherwise unremarkable.  Alcohol level was less than 10.  Urine drug screen was positive for cannabinoid and opiates. EKG as reviewed by me : Normal sinus rhythm with a rate of 77 Imaging: Abdominal pelvic CT scan revealed acute pancreatitis with no pseudocyst.  It showed hepatic steatosis and trace simple free fluid ascites.  The patient was given milk of magnesia 30 mL, 20 mg IV Pepcid, 0.5 mg IV Dilaudid, 4 mg of IV  Zofran and 1 L bolus of IV normal saline.  She will be admitted to a medical bed for further evaluation and management.  PAST MEDICAL HISTORY:   Past Medical History:  Diagnosis Date   ADHD (attention deficit hyperactivity disorder)    Hypertension   -Alcohol abuse and dependence.  PAST SURGICAL HISTORY:  History reviewed. No pertinent surgical history. No previous surgeries. SOCIAL HISTORY:   Social History   Tobacco Use   Smoking status: Every Day   Smokeless tobacco: Never  Substance Use Topics   Alcohol use: Yes    Comment: occ    FAMILY HISTORY:   Family History  Problem Relation Age of Onset   Breast cancer Mother    Cervical cancer Mother    Ovarian cancer Mother    Diabetes Mother    Hypertension Mother    Clotting disorder Mother        blood clots in legs or lungs   Sickle cell trait Father    Hypertension Father    Clotting disorder Sister        blood clots in legs or lungs    DRUG ALLERGIES:   Allergies  Allergen Reactions   Almond Oil Anaphylaxis   Other     apples    REVIEW OF SYSTEMS:   ROS As per history of present illness. All pertinent systems were reviewed above. Constitutional, HEENT, cardiovascular, respiratory, GI, GU, musculoskeletal,  neuro, psychiatric, endocrine, integumentary and hematologic systems were reviewed and are otherwise negative/unremarkable except for positive findings mentioned above in the HPI.   MEDICATIONS AT HOME:   Prior to Admission medications   Medication Sig Start Date End Date Taking? Authorizing Provider  hydrochlorothiazide (HYDRODIURIL) 25 MG tablet Take 25 mg by mouth daily. 07/23/22   [provider]  lisdexamfetamine (VYVANSE) 20 MG capsule Take 20 mg by mouth 2 (two) times daily.    [provider]      VITAL SIGNS:  Blood pressure (!) 140/97, pulse 84, temperature 98 F (36.7 C), temperature source Oral, resp. rate 20, height 5\' 1"  (1.549 m), weight 71.2 kg, last menstrual  period 05/22/2023, SpO2 100 %.  PHYSICAL EXAMINATION:  Physical Exam  GENERAL:  36 y.o.-year-old African-American female patient lying in the bed with no acute distress.  EYES: Pupils equal, round, reactive to light and accommodation. No scleral icterus. Extraocular muscles intact.  HEENT: Head atraumatic, normocephalic. Oropharynx and nasopharynx clear.  NECK:  Supple, no jugular venous distention. No thyroid enlargement, no tenderness.  LUNGS: Normal breath sounds bilaterally, no wheezing, rales,rhonchi or crepitation. No use of accessory muscles of respiration.  CARDIOVASCULAR: Regular rate and rhythm, S1, S2 normal. No murmurs, rubs, or gallops.  ABDOMEN: Soft, nondistended, with epigastric, left upper quadrant and to less extent right upper quadrant tenderness without rebound tenderness or guarding or rigidity.  Bowel sounds present. No organomegaly or mass.  EXTREMITIES: No pedal edema, cyanosis, or clubbing.  NEUROLOGIC: Cranial nerves II through XII are intact. Muscle strength 5/5 in all extremities. Sensation intact. Gait not checked.  PSYCHIATRIC: The patient is alert and oriented x 3.  Normal affect and good eye contact. SKIN: No obvious rash, lesion, or ulcer.   LABORATORY PANEL:   CBC Recent Labs  Lab 06/13/23 0435  WBC 8.4  HGB 9.0*  HCT 30.3*  PLT 200   ------------------------------------------------------------------------------------------------------------------  Chemistries  Recent Labs  Lab 06/13/23 0435  NA 133*  K 3.5  CL 101  CO2 17*  GLUCOSE 151*  BUN 6  CREATININE 0.68  CALCIUM 8.6*  AST 90*  ALT 64*  ALKPHOS 93  BILITOT 2.5*   ------------------------------------------------------------------------------------------------------------------  Cardiac Enzymes No results for input(s): "TROPONINI" in the last 168  hours. ------------------------------------------------------------------------------------------------------------------  RADIOLOGY:  CT ABDOMEN PELVIS W CONTRAST  Result Date: 06/13/2023 CLINICAL DATA:  Abdominal pain, acute, nonlocalized N/V EXAM: CT ABDOMEN AND PELVIS WITH CONTRAST TECHNIQUE: Multidetector CT imaging of the abdomen and pelvis was performed using the standard protocol following bolus administration of intravenous contrast. RADIATION DOSE REDUCTION: This exam was performed according to the departmental dose-optimization program which includes automated exposure control, adjustment of the mA and/or kV according to patient size and/or use of iterative reconstruction technique. CONTRAST:  OMNIPAQUE IOHEXOL 300 MG/ML  SOLN COMPARISON:  None Available. FINDINGS: Lower chest: No acute abnormality. Hepatobiliary: The liver is enlarged measuring up to 20 cm with question a Reidel lobe. The hepatic parenchyma is diffusely hypodense compared to the splenic parenchyma consistent with fatty infiltration. No focal liver abnormality. No gallstones, gallbladder wall thickening, or pericholecystic fluid. No biliary dilatation. Pancreas: Fluid density along the proximal mid pancreas. Poorly defined contour of the uncinate process. Vague fat stranding along the pancreatic tail. No main pancreatic ductal dilatation. No pseudocyst formation. Spleen: Normal in size without focal abnormality. Adrenals/Urinary Tract: No adrenal nodule bilaterally. Bilateral kidneys enhance symmetrically. No hydronephrosis. No hydroureter. The urinary bladder is unremarkable. Stomach/Bowel: Stomach is within normal limits. No  evidence of bowel wall thickening or dilatation. Appendix appears normal. Vascular/Lymphatic: No abdominal aorta or iliac aneurysm. Mild atherosclerotic plaque of the aorta and its branches. No abdominal, pelvic, or inguinal lymphadenopathy. Reproductive: Uterus and bilateral adnexa are unremarkable.  Other: Trace simple free fluid along the pancreas, duodenum, and upper abdomen. No intraperitoneal free gas. No organized fluid collection. Musculoskeletal: No abdominal wall hernia or abnormality. No suspicious lytic or blastic osseous lesions. No acute displaced fracture. IMPRESSION: 1. Acute pancreatitis.  No pseudocyst. 2. Hepatic steatosis. 3. Trace simple free fluid ascites. Electronically Signed   By: Tish Frederickson M.D.   On: 06/13/2023 01:40   DG Chest 2 View  Result Date: 06/12/2023 CLINICAL DATA:  Chest pain. EXAM: CHEST - 2 VIEW COMPARISON:  Chest radiograph dated 10/17/2022. FINDINGS: No focal consolidation, pleural effusion, or pneumothorax. The cardiac silhouette is within normal limits. No acute osseous pathology. IMPRESSION: No active cardiopulmonary disease. Electronically Signed   By: Elgie Collard M.D.   On: 06/12/2023 20:42      IMPRESSION AND PLAN:  Assessment and Plan: * Acute pancreatitis - This is likely acute alcoholic pancreatitis. - We will stop her HCTZ. - The patient will be admitted to a medical bed. - Will be kept NPO. - Will be hydrated with IV normal saline. - Pain management will be provided.  Elevated LFTs - This could be related to mild alcoholic hepatitis. - We will follow LFTs with hydration.  Alcohol abuse - Will be monitored for alcohol withdrawal. - We will place on as needed IV Ativan per CIWA protocol. - Will be placed on oral thiamine, multivitamins and folic acid.  Polysubstance abuse (HCC) - In addition to alcohol this includes tobacco and marijuana. - I counseled her for cessation of all.  She will receive further counseling here.  Depression - We will continue her Zoloft.  Essential hypertension - We will stop her HCTZ and place her on Norvasc.    DVT prophylaxis: Lovenox.  Advanced Care Planning:  Code Status: full code.  Family Communication:  The plan of care was discussed in details with the patient (and family). I  answered all questions. The patient agreed to proceed with the above mentioned plan. Further management will depend upon hospital course. Disposition Plan: Back to previous home environment Consults called: none.  All the records are reviewed and case discussed with ED provider.  Status is: Inpatient  At the time of the admission, it appears that the appropriate admission status for this patient is inpatient.  This is judged to be reasonable and necessary in order to provide the required intensity of service to ensure the patient's safety given the presenting symptoms, physical exam findings and initial radiographic and laboratory data in the context of comorbid conditions.  The patient requires inpatient status due to high intensity of service, high risk of further deterioration and high frequency of surveillance required.  I certify that at the time of admission, it is my clinical judgment that the patient will require inpatient hospital care extending more than 2 midnights.                            Dispo: The patient is from: Home              Anticipated d/c is to: Home              Patient currently is not medically stable to d/c.  Difficult to place patient: No  Hannah Beat M.D on 06/13/2023 at 7:27 AM  [facility]   From 7 PM-7 AM, contact night-coverage [email or web address]  CC: Primary care physician; Jenell Milliner, MD

## 2023-06-13 NOTE — Progress Notes (Signed)
Pt reported new onset of blurred vision to her primary nurse Kelsi; BP-138/102, BG-102; she denies wearing contacts or glasses.   Neuro assessment completed by this nurse WNL, PERRLA.  No signs of withdrawals noted at this time.  Pt reports her blurred vision to have started around 4 pm and she told 3-4 staff members.  She reports the blurriness to be  consistent and no better or worse since it began. She also reports having migraines but does not associate the blurred vision to having a migraine.   On call provider notified.  Ordered to continue to monitor.

## 2023-06-13 NOTE — ED Notes (Signed)
Pt was just vomiting.

## 2023-06-13 NOTE — Assessment & Plan Note (Signed)
-   We will stop her HCTZ and place her on Norvasc.

## 2023-06-13 NOTE — Assessment & Plan Note (Signed)
-   In addition to alcohol this includes tobacco and marijuana. - I counseled her for cessation of all.  She will receive further counseling here.

## 2023-06-13 NOTE — Assessment & Plan Note (Signed)
-   We will continue her Zoloft. 

## 2023-06-13 NOTE — Assessment & Plan Note (Addendum)
-   Will be monitored for alcohol withdrawal. - We will place on as needed IV Ativan per CIWA protocol. - Will be placed on oral thiamine, multivitamins and folic acid.

## 2023-06-14 DIAGNOSIS — K852 Alcohol induced acute pancreatitis without necrosis or infection: Secondary | ICD-10-CM | POA: Diagnosis not present

## 2023-06-14 LAB — COMPREHENSIVE METABOLIC PANEL
ALT: 42 U/L (ref 0–44)
AST: 59 U/L — ABNORMAL HIGH (ref 15–41)
Albumin: 3.8 g/dL (ref 3.5–5.0)
Alkaline Phosphatase: 72 U/L (ref 38–126)
Anion gap: 10 (ref 5–15)
BUN: <5 mg/dL — ABNORMAL LOW (ref 6–20)
CO2: 27 mmol/L (ref 22–32)
Calcium: 8.8 mg/dL — ABNORMAL LOW (ref 8.9–10.3)
Chloride: 96 mmol/L — ABNORMAL LOW (ref 98–111)
Creatinine, Ser: 0.5 mg/dL (ref 0.44–1.00)
GFR, Estimated: >60 mL/min (ref >60)
Glucose, Bld: 94 mg/dL (ref 70–99)
Potassium: 2.9 mmol/L — ABNORMAL LOW (ref 3.5–5.1)
Sodium: 133 mmol/L — ABNORMAL LOW (ref 135–145)
Total Bilirubin: 1.6 mg/dL — ABNORMAL HIGH (ref 0.3–1.2)
Total Protein: 6.3 g/dL — ABNORMAL LOW (ref 6.5–8.1)

## 2023-06-14 LAB — CBC
HCT: 28.6 % — ABNORMAL LOW (ref 36.0–46.0)
Hemoglobin: 8.8 g/dL — ABNORMAL LOW (ref 12.0–15.0)
MCH: 26.7 pg (ref 26.0–34.0)
MCHC: 30.8 g/dL (ref 30.0–36.0)
MCV: 86.9 fL (ref 80.0–100.0)
Platelets: 144 10*3/uL — ABNORMAL LOW (ref 150–400)
RBC: 3.29 MIL/uL — ABNORMAL LOW (ref 3.87–5.11)
RDW: 15.7 % — ABNORMAL HIGH (ref 11.5–15.5)
WBC: 5.4 10*3/uL (ref 4.0–10.5)
nRBC: 0 % (ref 0.0–0.2)

## 2023-06-14 LAB — MAGNESIUM: Magnesium: 1.9 mg/dL (ref 1.7–2.4)

## 2023-06-14 LAB — PHOSPHORUS
Phosphorus: <1 mg/dL — CL (ref 2.5–4.6)
Phosphorus: 2.1 mg/dL — ABNORMAL LOW (ref 2.5–4.6)

## 2023-06-14 LAB — LIPASE, BLOOD: Lipase: 704 U/L — ABNORMAL HIGH (ref 11–51)

## 2023-06-14 MED ORDER — POTASSIUM CHLORIDE CRYS ER 20 MEQ PO TBCR
20.0000 meq | EXTENDED_RELEASE_TABLET | Freq: Once | ORAL | Status: AC
  Administered 2023-06-14: 20 meq via ORAL
  Filled 2023-06-14: qty 1

## 2023-06-14 MED ORDER — OXYCODONE HCL 5 MG PO TABS
5.0000 mg | ORAL_TABLET | Freq: Four times a day (QID) | ORAL | Status: DC | PRN
  Administered 2023-06-14 – 2023-06-16 (×7): 5 mg via ORAL
  Filled 2023-06-14 (×7): qty 1

## 2023-06-14 MED ORDER — POTASSIUM PHOSPHATES 15 MMOLE/5ML IV SOLN
45.0000 mmol | Freq: Once | INTRAVENOUS | Status: AC
  Administered 2023-06-14: 45 mmol via INTRAVENOUS
  Filled 2023-06-14: qty 15

## 2023-06-14 MED ORDER — LACTATED RINGERS IV BOLUS
1000.0000 mL | Freq: Once | INTRAVENOUS | Status: AC
  Administered 2023-06-14: 1000 mL via INTRAVENOUS

## 2023-06-14 MED ORDER — NICOTINE 14 MG/24HR TD PT24
14.0000 mg | MEDICATED_PATCH | Freq: Every day | TRANSDERMAL | Status: DC
  Administered 2023-06-14 – 2023-06-16 (×3): 14 mg via TRANSDERMAL
  Filled 2023-06-14 (×3): qty 1

## 2023-06-14 MED ORDER — POLYVINYL ALCOHOL 1.4 % OP SOLN
2.0000 [drp] | OPHTHALMIC | Status: DC | PRN
  Administered 2023-06-14 – 2023-06-16 (×4): 2 [drp] via OPHTHALMIC
  Filled 2023-06-14: qty 15

## 2023-06-14 NOTE — Progress Notes (Addendum)
       CROSS COVER NOTE  NAME: Tynisha Mesecher MRN: [id number] DOB : 11/27/1986    Concern as stated by nurse / staff    Hello there. Pt admitted with abdominal pain, chest pain and emesis; diagnosed with Pancreatitis. She reported new onset of blurred vision to her primary nurse Kelsi and she checked her BP-138/102, BG-102. I did a Neuro assessment which is normal, PERRLA. Pt reports to me that her blurred vision started around 4 pm and she told 3-4 people about it. She says the blurriness is consistent and hasn't gotten any better or worse since starting. She also reports having migraines but does not associate the blurred vision to having a migraine.     Pertinent findings on chart review: H & P imaging, labs reviewed. No concerning focal deficits reported. Is receiving ativan and morphine, both could be contributory to blurred vision. Could also be metabolic  Assessment and  Interventions   Assessment:  Plan: If no concerning neuro symptoms evolve, refer to outpatient ophthalmologist at discharge if symptoms not resolved        Donnie Mesa NP [facility] Cross Cover 7pm-7am - check amion for availability Pager [phone]

## 2023-06-14 NOTE — Plan of Care (Signed)
Patient A&Ox4, from home, independent in room  

## 2023-06-14 NOTE — Progress Notes (Signed)
Gina Minium, MD [facility]   [address], [address] [place], [place] [address] Phone: [phone] Fax : [phone]   Subjective: The patient is sitting up in bed in no apparent distress today.  The patient states that her pain yesterday was 10 out of 10 and is only 6 out of 10 today.  She appears much better today without visible signs of pain like she was yesterday doubled over.  She is sitting up in bed and is tolerating a clear liquid diet.  Patient' lipase increased today but the liver enzymes remain normal.   Objective: Vital signs in last 24 hours: Vitals:   06/13/23 2200 06/14/23 0024 06/14/23 0543 06/14/23 0741  BP: (!) 138/102 (!) 135/101 (!) 134/99 (!) 131/102  Pulse:  88 82 85  Resp:  16 16 18   Temp:  98.2 F (36.8 C)  (!) 97.4 F (36.3 C)  TempSrc:  Oral    SpO2:  100% 100% 100%  Weight:      Height:       Weight change:   Intake/Output Summary (Last 24 hours) at 06/14/2023 1238 Last data filed at 06/14/2023 0300 Gross per 24 hour  Intake 2078.47 ml  Output --  Net 2078.47 ml     Exam: General: The patient is sitting up in bed in no apparent distress tolerating liquids   Lab Results: @LABTEST2 @ Micro Results: No results found for this or any previous visit (from the past 240 hour(s)). Studies/Results: CT ABDOMEN PELVIS W CONTRAST  Result Date: 06/13/2023 CLINICAL DATA:  Abdominal pain, acute, nonlocalized N/V EXAM: CT ABDOMEN AND PELVIS WITH CONTRAST TECHNIQUE: Multidetector CT imaging of the abdomen and pelvis was performed using the standard protocol following bolus administration of intravenous contrast. RADIATION DOSE REDUCTION: This exam was performed according to the departmental dose-optimization program which includes automated exposure control, adjustment of the mA and/or kV according to patient size and/or use of iterative reconstruction technique. CONTRAST:  OMNIPAQUE IOHEXOL 300 MG/ML  SOLN COMPARISON:  None Available. FINDINGS: Lower  chest: No acute abnormality. Hepatobiliary: The liver is enlarged measuring up to 20 cm with question a Reidel lobe. The hepatic parenchyma is diffusely hypodense compared to the splenic parenchyma consistent with fatty infiltration. No focal liver abnormality. No gallstones, gallbladder wall thickening, or pericholecystic fluid. No biliary dilatation. Pancreas: Fluid density along the proximal mid pancreas. Poorly defined contour of the uncinate process. Vague fat stranding along the pancreatic tail. No main pancreatic ductal dilatation. No pseudocyst formation. Spleen: Normal in size without focal abnormality. Adrenals/Urinary Tract: No adrenal nodule bilaterally. Bilateral kidneys enhance symmetrically. No hydronephrosis. No hydroureter. The urinary bladder is unremarkable. Stomach/Bowel: Stomach is within normal limits. No evidence of bowel wall thickening or dilatation. Appendix appears normal. Vascular/Lymphatic: No abdominal aorta or iliac aneurysm. Mild atherosclerotic plaque of the aorta and its branches. No abdominal, pelvic, or inguinal lymphadenopathy. Reproductive: Uterus and bilateral adnexa are unremarkable. Other: Trace simple free fluid along the pancreas, duodenum, and upper abdomen. No intraperitoneal free gas. No organized fluid collection. Musculoskeletal: No abdominal wall hernia or abnormality. No suspicious lytic or blastic osseous lesions. No acute displaced fracture. IMPRESSION: 1. Acute pancreatitis.  No pseudocyst. 2. Hepatic steatosis. 3. Trace simple free fluid ascites. Electronically Signed   By: Tish Frederickson M.D.   On: 06/13/2023 01:40   DG Chest 2 View  Result Date: 06/12/2023 CLINICAL DATA:  Chest pain. EXAM: CHEST - 2 VIEW COMPARISON:  Chest radiograph dated 10/17/2022. FINDINGS: No focal consolidation, pleural effusion,  or pneumothorax. The cardiac silhouette is within normal limits. No acute osseous pathology. IMPRESSION: No active cardiopulmonary disease. Electronically  Signed   By: Elgie Collard M.D.   On: 06/12/2023 20:42   Medications: I have reviewed the patient's current medications. Scheduled Meds:  amLODipine  5 mg Oral Daily   enoxaparin (LOVENOX) injection  40 mg Subcutaneous Q24H   folic acid  1 mg Oral Daily   LORazepam  0-4 mg Intravenous Q6H   pantoprazole  40 mg Oral Daily   thiamine  100 mg Oral Daily   Continuous Infusions:  lactated ringers 150 mL/hr at 06/14/23 1226   potassium PHOSPHATE IVPB (in mmol) 45 mmol (06/14/23 0947)   PRN Meds:.acetaminophen **OR** acetaminophen, alum & mag hydroxide-simeth **AND** lidocaine, magnesium hydroxide, morphine injection, ondansetron **OR** ondansetron (ZOFRAN) IV, oxyCODONE, polyvinyl alcohol, traZODone   Assessment: Principal Problem:   Acute pancreatitis Active Problems:   Alcohol abuse   Elevated LFTs   Essential hypertension   Depression   Polysubstance abuse (HCC)    Plan: This patient has acute pancreatitis with a cause being alcohol.  The patient has been told to stop her alcohol abuse.  She is feeling much better today and has no worry symptoms.  The patient has been told that she needs to stop her alcohol use.  There is nothing further to do from a GI point of view except to recommend continued hydration and pain medication and contact us if the patient should deteriorate.  I will sign off.  Please call if any further GI concerns or questions.  We would like to thank you for the opportunity to participate in the care of Gina Rich.    LOS: 1 day   Sherlyn Hay 06/14/2023, 12:38 PM Pager [phone] 7am-5pm  Check AMION for 5pm -7am coverage and on weekends

## 2023-06-14 NOTE — Progress Notes (Signed)
PROGRESS NOTE Gina Rich  UJW:[id number] DOB: 06/07/1986 DOA: 06/12/2023 PCP: Jenell Milliner, MD  Brief Narrative/Hospital Course: 37 y.o.f w/ history of ADHD, hypertension, alcohol abuse and dependence with polysubstance abuse including tobacco and marijuana presented with complaint of epigastric, left upper quadrant abdominal pain for the last day with nausea and vomiting Her last alcoholic drink was a couple of days ago.  Also complained of dark stool, occasional bleeding from her hemorrhoids, mild diarrhea,No report of fever, chills   In ED: Vitals stable, labs lipase 228 AST 127 ALT 77, TB 2.5 troponin 2, 3, CBC with normocytic anemia hemoglobin 10 g  Urine pregnancy test was negative.  UA showed with 80 ketones and was otherwise unremarkable.  Alcohol level <10. Urine drug screen was positive for cannabinoid and opiates. EKG : Normal sinus rhythm with a rate of 77 Imaging: CT abdominal pelvis with contrast:acute pancreatitis, hepatic steatosis with no pseudocyst. Patient admitted for further management.    Subjective: Patient seen and examined this morning  Overnight afebrile, BP stable Having ongoing abdominal pain, Was having blurry vision and dry eyes without any numbness tingling or focal weakness Denies double vision numbness tingling focal weakness Nauseous and appears somewhat sluggish, has been getting morphine and Oxy for pain control along with Ativan for alcohol withdrawal  Assessment and Plan: Principal Problem:   Acute pancreatitis Active Problems:   Elevated LFTs   Alcohol abuse   Essential hypertension   Depression   Polysubstance abuse (HCC)  Acute pancreatitis likely alcoholic pancreatitis Abnormal LFTs likely alcohol induced Hepatic steatosis: Continue on current supportive care with aggressive IV hydration, pain management, antiemetics, diet as tolerated, HCTZ stopped for now. Diet as tolerated.LFTs level normalizing.CT imaging shows hepatic steatosis no  gallstone, no gallbladder wall thickening and no biliary dilatation.  Seen by GI input appreciated  Alcohol abuse at risk of withdrawal: CIWA scale at 2-6. Cessation counselled in length. TOC consult and continue with.CIWA scale Ativan thiamine folate vitamins will be continued monitor for withdrawals.   Recent Labs  Lab 06/12/23 2028 06/13/23 0435 06/13/23 0829 06/14/23 0558  AST 127* 90*  --  59*  ALT 77* 64*  --  42  ALKPHOS 103 93  --  72  BILITOT 2.5* 2.5*  --  1.6*  PROT 8.7* 7.7  --  6.3*  ALBUMIN 5.2* 4.6  --  3.8  LIPASE 228*  --  434* 704*  PLT 225 200  --  144*   Hypokalemia Hypophosphatemia Replete potassium and phosphorus aggressively and recheck later tonight. Recent Labs  Lab 06/12/23 2028 06/13/23 0435 06/14/23 0558  K 3.5 3.5 2.9*  CALCIUM 8.9 8.6* 8.8*  MG  --   --  1.9  PHOS  --   --  <1.0*   Dry eyes/blurry vision: No double vision no focal weakness, added eyedrops, likely multifactorial in the setting of opioid use alcohol withdrawal, Ativan use.  No neurological signs noted on visual confrontation test no visual field defect or double vision  Depression: With the patient stable, continue Zoloft  Essential hypertension: HCTZ stopped due to 1.  Continue amlodipine 5 mg  Normocytic anemia: appears chronic, monitor hb Recent Labs  Lab 06/12/23 2028 06/13/23 0435 06/14/23 0558  HGB 10.0* 9.0* 8.8*  HCT 34.5* 30.3* 28.6*   DVT prophylaxis: enoxaparin (LOVENOX) injection 40 mg Start: 06/13/23 1000 Code Status:   Code Status: Full Code Family Communication: plan of care discussed with patient at bedside. Patient status is: Inpatient because of acute pancreatitis  Level of care: Med-Surg   Dispo: The patient is from: Home            Anticipated disposition: home >2 days  Objective: Vitals last 24 hrs: Vitals:   06/13/23 2200 06/14/23 0024 06/14/23 0543 06/14/23 0741  BP: (!) 138/102 (!) 135/101 (!) 134/99 (!) 131/102  Pulse:  88 82 85   Resp:  16 16 18   Temp:  98.2 F (36.8 C)  (!) 97.4 F (36.3 C)  TempSrc:  Oral    SpO2:  100% 100% 100%  Weight:      Height:       Weight change:   Physical Examination: General exam: alert awake, appears somewhat sluggish, mildly drowsy HEENT:Oral mucosa moist, Ear/Nose WNL grossly Respiratory system: bilaterally clear BS, no use of accessory muscle Cardiovascular system: S1 & S2 +, No JVD. Gastrointestinal system: Abdomen soft, tender in midepigastric area mildly bloated abdomen,BS+ Nervous System:Alert, awake, moving extremities. Extremities: LE edema neg,distal peripheral pulses palpable.  Skin: No rashes,no icterus. MSK: Normal muscle bulk,tone, power  Medications reviewed:  Scheduled Meds:  amLODipine  5 mg Oral Daily   enoxaparin (LOVENOX) injection  40 mg Subcutaneous Q24H   folic acid  1 mg Oral Daily   LORazepam  0-4 mg Intravenous Q6H   pantoprazole  40 mg Oral Daily   thiamine  100 mg Oral Daily  Continuous Infusions:  lactated ringers 150 mL/hr at 06/14/23 0842   potassium PHOSPHATE IVPB (in mmol) 45 mmol (06/14/23 0947)    Diet Order             Diet clear liquid Room service appropriate? Yes; Fluid consistency: Thin  Diet effective now                  Intake/Output Summary (Last 24 hours) at 06/14/2023 1203 Last data filed at 06/14/2023 0300 Gross per 24 hour  Intake 2078.47 ml  Output --  Net 2078.47 ml   Net IO Since Admission: 3,428.47 mL [06/14/23 1203]  Wt Readings from Last 3 Encounters:  06/12/23 71.2 kg  04/20/23 70.3 kg  10/15/22 71.2 kg   Unresulted Labs (From admission, onward)     Start     Ordered   06/14/23 2000  Phosphorus  (Serum Phosphorus)  Once-Timed,   TIMED        06/14/23 0733   06/14/23 2000  Basic metabolic panel  Once,   R        06/14/23 0842   06/14/23 0500  Comprehensive metabolic panel  Daily,   R      06/13/23 0740   06/14/23 0500  CBC  Daily,   R      06/13/23 0740          Data Reviewed: I have  personally reviewed following labs and imaging studies CBC: Recent Labs  Lab 06/12/23 2028 06/13/23 0435 06/14/23 0558  WBC 10.5 8.4 5.4  NEUTROABS 9.4*  --   --   HGB 10.0* 9.0* 8.8*  HCT 34.5* 30.3* 28.6*  MCV 90.3 88.6 86.9  PLT 225 200 144*   Basic Metabolic Panel: Recent Labs  Lab 06/12/23 2028 06/13/23 0435 06/14/23 0558  NA 135 133* 133*  K 3.5 3.5 2.9*  CL 97* 101 96*  CO2 11* 17* 27  GLUCOSE 99 151* 94  BUN 7 6 <5*  CREATININE 0.89 0.68 0.50  CALCIUM 8.9 8.6* 8.8*  MG  --   --  1.9  PHOS  --   --  <  1.0*   GFR: Estimated Creatinine Clearance: 88.6 mL/min (by C-G formula based on SCr of 0.5 mg/dL). Liver Function Tests: Recent Labs  Lab 06/12/23 2028 06/13/23 0435 06/14/23 0558  AST 127* 90* 59*  ALT 77* 64* 42  ALKPHOS 103 93 72  BILITOT 2.5* 2.5* 1.6*  PROT 8.7* 7.7 6.3*  ALBUMIN 5.2* 4.6 3.8   Recent Labs  Lab 06/12/23 2028 06/13/23 0829 06/14/23 0558  LIPASE 228* 434* 704*  CBG: Recent Labs  Lab 06/13/23 2204  GLUCAP 102*  No results found for this or any previous visit (from the past 240 hour(s)).  Antimicrobials: Anti-infectives (From admission, onward)    None      Culture/Microbiology    Component Value Date/Time   SDES BLOOD LEFT ANTECUBITAL 10/18/2022 0821   SPECREQUEST  10/18/2022 0821    BOTTLES DRAWN AEROBIC AND ANAEROBIC Blood Culture adequate volume   CULT  10/18/2022 0821    NO GROWTH 5 DAYS Performed at [facility], [address] [place] [place], [place] [address]    REPTSTATUS 10/23/2022 FINAL 10/18/2022 6213  Radiology Studies: CT ABDOMEN PELVIS W CONTRAST  Result Date: 06/13/2023 CLINICAL DATA:  Abdominal pain, acute, nonlocalized N/V EXAM: CT ABDOMEN AND PELVIS WITH CONTRAST TECHNIQUE: Multidetector CT imaging of the abdomen and pelvis was performed using the standard protocol following bolus administration of intravenous contrast. RADIATION DOSE REDUCTION: This exam was performed according to the  departmental dose-optimization program which includes automated exposure control, adjustment of the mA and/or kV according to patient size and/or use of iterative reconstruction technique. CONTRAST:  OMNIPAQUE IOHEXOL 300 MG/ML  SOLN COMPARISON:  None Available. FINDINGS: Lower chest: No acute abnormality. Hepatobiliary: The liver is enlarged measuring up to 20 cm with question a Reidel lobe. The hepatic parenchyma is diffusely hypodense compared to the splenic parenchyma consistent with fatty infiltration. No focal liver abnormality. No gallstones, gallbladder wall thickening, or pericholecystic fluid. No biliary dilatation. Pancreas: Fluid density along the proximal mid pancreas. Poorly defined contour of the uncinate process. Vague fat stranding along the pancreatic tail. No main pancreatic ductal dilatation. No pseudocyst formation. Spleen: Normal in size without focal abnormality. Adrenals/Urinary Tract: No adrenal nodule bilaterally. Bilateral kidneys enhance symmetrically. No hydronephrosis. No hydroureter. The urinary bladder is unremarkable. Stomach/Bowel: Stomach is within normal limits. No evidence of bowel wall thickening or dilatation. Appendix appears normal. Vascular/Lymphatic: No abdominal aorta or iliac aneurysm. Mild atherosclerotic plaque of the aorta and its branches. No abdominal, pelvic, or inguinal lymphadenopathy. Reproductive: Uterus and bilateral adnexa are unremarkable. Other: Trace simple free fluid along the pancreas, duodenum, and upper abdomen. No intraperitoneal free gas. No organized fluid collection. Musculoskeletal: No abdominal wall hernia or abnormality. No suspicious lytic or blastic osseous lesions. No acute displaced fracture. IMPRESSION: 1. Acute pancreatitis.  No pseudocyst. 2. Hepatic steatosis. 3. Trace simple free fluid ascites. Electronically Signed   By: Tish Frederickson M.D.   On: 06/13/2023 01:40   DG Chest 2 View  Result Date: 06/12/2023 CLINICAL DATA:   Chest pain. EXAM: CHEST - 2 VIEW COMPARISON:  Chest radiograph dated 10/17/2022. FINDINGS: No focal consolidation, pleural effusion, or pneumothorax. The cardiac silhouette is within normal limits. No acute osseous pathology. IMPRESSION: No active cardiopulmonary disease. Electronically Signed   By: Elgie Collard M.D.   On: 06/12/2023 20:42     LOS: 1 day   Lanae Boast, MD [facility]  06/14/2023, 12:03 PM

## 2023-06-15 DIAGNOSIS — K852 Alcohol induced acute pancreatitis without necrosis or infection: Secondary | ICD-10-CM | POA: Diagnosis not present

## 2023-06-15 LAB — BASIC METABOLIC PANEL
Anion gap: 7 (ref 5–15)
BUN: <5 mg/dL — ABNORMAL LOW (ref 6–20)
CO2: 32 mmol/L (ref 22–32)
Calcium: 9.2 mg/dL (ref 8.9–10.3)
Chloride: 96 mmol/L — ABNORMAL LOW (ref 98–111)
Creatinine, Ser: 0.45 mg/dL (ref 0.44–1.00)
GFR, Estimated: >60 mL/min (ref >60)
Glucose, Bld: 105 mg/dL — ABNORMAL HIGH (ref 70–99)
Potassium: 3.4 mmol/L — ABNORMAL LOW (ref 3.5–5.1)
Sodium: 135 mmol/L (ref 135–145)

## 2023-06-15 LAB — COMPREHENSIVE METABOLIC PANEL
ALT: 46 U/L — ABNORMAL HIGH (ref 0–44)
AST: 95 U/L — ABNORMAL HIGH (ref 15–41)
Albumin: 3.6 g/dL (ref 3.5–5.0)
Alkaline Phosphatase: 74 U/L (ref 38–126)
Anion gap: 8 (ref 5–15)
BUN: <5 mg/dL — ABNORMAL LOW (ref 6–20)
CO2: 32 mmol/L (ref 22–32)
Calcium: 9.3 mg/dL (ref 8.9–10.3)
Chloride: 96 mmol/L — ABNORMAL LOW (ref 98–111)
Creatinine, Ser: 0.46 mg/dL (ref 0.44–1.00)
GFR, Estimated: >60 mL/min (ref >60)
Glucose, Bld: 98 mg/dL (ref 70–99)
Potassium: 2.9 mmol/L — ABNORMAL LOW (ref 3.5–5.1)
Sodium: 136 mmol/L (ref 135–145)
Total Bilirubin: 1.3 mg/dL — ABNORMAL HIGH (ref 0.3–1.2)
Total Protein: 6.5 g/dL (ref 6.5–8.1)

## 2023-06-15 LAB — CBC
HCT: 29.4 % — ABNORMAL LOW (ref 36.0–46.0)
Hemoglobin: 8.8 g/dL — ABNORMAL LOW (ref 12.0–15.0)
MCH: 26.1 pg (ref 26.0–34.0)
MCHC: 29.9 g/dL — ABNORMAL LOW (ref 30.0–36.0)
MCV: 87.2 fL (ref 80.0–100.0)
Platelets: 154 10*3/uL (ref 150–400)
RBC: 3.37 MIL/uL — ABNORMAL LOW (ref 3.87–5.11)
RDW: 15.8 % — ABNORMAL HIGH (ref 11.5–15.5)
WBC: 5.3 10*3/uL (ref 4.0–10.5)
nRBC: 0.4 % — ABNORMAL HIGH (ref 0.0–0.2)

## 2023-06-15 MED ORDER — POTASSIUM CHLORIDE CRYS ER 20 MEQ PO TBCR
40.0000 meq | EXTENDED_RELEASE_TABLET | Freq: Once | ORAL | Status: AC
  Administered 2023-06-15: 40 meq via ORAL
  Filled 2023-06-15: qty 2

## 2023-06-15 MED ORDER — POTASSIUM CHLORIDE 10 MEQ/100ML IV SOLN
10.0000 meq | INTRAVENOUS | Status: AC
  Administered 2023-06-15 (×4): 10 meq via INTRAVENOUS
  Filled 2023-06-15 (×4): qty 100

## 2023-06-15 MED ORDER — CALCIUM CARBONATE ANTACID 500 MG PO CHEW
1.0000 | CHEWABLE_TABLET | Freq: Four times a day (QID) | ORAL | Status: DC | PRN
  Administered 2023-06-15 – 2023-06-16 (×3): 200 mg via ORAL
  Filled 2023-06-15 (×3): qty 1

## 2023-06-15 MED ORDER — K PHOS MONO-SOD PHOS DI & MONO 155-852-130 MG PO TABS
250.0000 mg | ORAL_TABLET | Freq: Three times a day (TID) | ORAL | Status: DC
  Administered 2023-06-15 – 2023-06-16 (×4): 250 mg via ORAL
  Filled 2023-06-15 (×4): qty 1

## 2023-06-15 NOTE — TOC CM/SW Note (Signed)
TOC consulted for SA resources. Resources have been added to be included on AVS.  Cadell Gabrielson, LCSW Transitions of Care Department [phone]

## 2023-06-15 NOTE — Plan of Care (Signed)
Patient A&Ox4, from home, independent in room. Pain partially controlled with medication. Patient has expressed wanting to go home soon. No significant changes this shift.

## 2023-06-15 NOTE — Plan of Care (Signed)

## 2023-06-15 NOTE — Progress Notes (Signed)
PROGRESS NOTE Gina Rich  ZOX:[id number] DOB: December 18, 1985 DOA: 06/12/2023 PCP: Jenell Milliner, MD  Brief Narrative/Hospital Course: 37 y.o.f w/ history of ADHD, hypertension, alcohol abuse and dependence with polysubstance abuse including tobacco and marijuana presented with complaint of epigastric, left upper quadrant abdominal pain for the last day with nausea and vomiting Her last alcoholic drink was a couple of days ago.  Also complained of dark stool, occasional bleeding from her hemorrhoids, mild diarrhea,No report of fever, chills   In ED: Vitals stable, labs lipase 228 AST 127 ALT 77, TB 2.5 troponin 2, 3, CBC with normocytic anemia hemoglobin 10 g  Urine pregnancy test was negative.  UA showed with 80 ketones and was otherwise unremarkable.  Alcohol level <10. Urine drug screen was positive for cannabinoid and opiates. EKG : Normal sinus rhythm with a rate of 77 Imaging: CT abdominal pelvis with contrast:acute pancreatitis, hepatic steatosis with no pseudocyst. Patient admitted for further management.    Subjective: Seen this am Aaox3, pain better overall wants to eat solid Overnight afebrile BP stable  Labs shows persistent hypokalemia  LFTs fluctuating  Had solid bm last nighy   Assessment and Plan: Principal Problem:   Acute pancreatitis Active Problems:   Elevated LFTs   Alcohol abuse   Essential hypertension   Depression   Polysubstance abuse (HCC)  Acute alcohol induced pancreatitis Alcoholic hepatitis  Hepatic steatosis: Patient does binge alcohol drinking, lab with transaminitis level fluctuating, CT abdomen on admission showing acute pancreatitis without pseudocyst.  Vital stable, labs shows LFTs fluctuating and lower than admission value, bicarb at 32 calcium normal WBC platelet count normal.  Continue with  ivf, PPI, antiemetics, pain management. GI has seen and signed off.Overall improving advance to [facility] and ADAT diet  Alcohol abuse at risk of  withdrawal: CIWA scale was 1-3,cessation counselled in length. TOC consult with resources.  Continue thiamine folate vitamins and Ativan as needed.   Recent Labs  Lab 06/12/23 2028 06/13/23 0435 06/13/23 0829 06/14/23 0558 06/15/23 0518  AST 127* 90*  --  59* 95*  ALT 77* 64*  --  42 46*  ALKPHOS 103 93  --  72 74  BILITOT 2.5* 2.5*  --  1.6* 1.3*  PROT 8.7* 7.7  --  6.3* 6.5  ALBUMIN 5.2* 4.6  --  3.8 3.6  LIPASE 228*  --  434* 704*  --   PLT 225 200  --  144* 154   Hypokalemia Hypophosphatemia Replete electrolytes aggressively again  Recent Labs  Lab 06/12/23 2028 06/13/23 0435 06/14/23 0558 06/14/23 2004 06/15/23 0518  K 3.5 3.5 2.9* 3.4* 2.9*  CALCIUM 8.9 8.6* 8.8* 9.2 9.3  MG  --   --  1.9  --   --   PHOS  --   --  <1.0* 2.1*  --    Dry eyes/blurry vision: No double vision no focal weakness,  cont eyedrops, likely multifactorial in the setting of opioid use alcohol withdrawal, Ativan use  Depression: Mood stable, continue Zoloft  Essential hypertension: HCTZ stopped due to 1.  Controlled on amlodipine 5 mg  Normocytic anemia: appears chronic, monitor hb Recent Labs  Lab 06/12/23 2028 06/13/23 0435 06/14/23 0558 06/15/23 0518  HGB 10.0* 9.0* 8.8* 8.8*  HCT 34.5* 30.3* 28.6* 29.4*   DVT prophylaxis: enoxaparin (LOVENOX) injection 40 mg Start: 06/13/23 1000 Code Status:   Code Status: Full Code Family Communication: plan of care discussed with patient at bedside. Patient status is: Inpatient because of acute pancreatitis Level  of care: Med-Surg   Dispo: The patient is from: Home            Anticipated disposition: home >2 days  Objective: Vitals last 24 hrs: Vitals:   06/14/23 0741 06/14/23 1622 06/14/23 2300 06/15/23 0840  BP: (!) 131/102 (!) 122/97 (!) 128/95 (!) 125/102  Pulse: 85 94 (!) 102 100  Resp: 18 18 20 18   Temp: (!) 97.4 F (36.3 C) 98 F (36.7 C) 98 F (36.7 C) 98.2 F (36.8 C)  TempSrc:      SpO2: 100% 100% 100% 100%  Weight:       Height:       Weight change:   Physical Examination: General exam: AAOXs obese HEENT:Oral mucosa moist, Ear/Nose WNL grossly, dentition normal. Respiratory system: bilaterally clear BS, no use of accessory muscle Cardiovascular system: S1 & S2 +, regular rate. Gastrointestinal system: Abdomen soft, mildly tender mid abdomen,BS+ Nervous System:Alert, awake, moving extremities and grossly nonfocal Extremities: LE ankle edema neg, lower extremities warm Skin: No rashes,no icterus. MSK: normal/small/weak muscle bulk,tone, power   Medications reviewed:  Scheduled Meds:  amLODipine  5 mg Oral Daily   enoxaparin (LOVENOX) injection  40 mg Subcutaneous Q24H   folic acid  1 mg Oral Daily   nicotine  14 mg Transdermal Daily   pantoprazole  40 mg Oral Daily   phosphorus  250 mg Oral TID   thiamine  100 mg Oral Daily  Continuous Infusions:  lactated ringers 150 mL/hr at 06/15/23 0254   potassium chloride 10 mEq (06/15/23 0832)    Diet Order             Diet full liquid Room service appropriate? Yes; Fluid consistency: Thin  Diet effective now                  Intake/Output Summary (Last 24 hours) at 06/15/2023 0858 Last data filed at 06/15/2023 0300 Gross per 24 hour  Intake 2921 ml  Output --  Net 2921 ml   Net IO Since Admission: 6,349.47 mL [06/15/23 0858]  Wt Readings from Last 3 Encounters:  06/12/23 71.2 kg  04/20/23 70.3 kg  10/15/22 71.2 kg   Unresulted Labs (From admission, onward)     Start     Ordered   06/14/23 0500  Comprehensive metabolic panel  Daily,   R      06/13/23 0740   06/14/23 0500  CBC  Daily,   R      06/13/23 0740          Data Reviewed: I have personally reviewed following labs and imaging studies CBC: Recent Labs  Lab 06/12/23 2028 06/13/23 0435 06/14/23 0558 06/15/23 0518  WBC 10.5 8.4 5.4 5.3  NEUTROABS 9.4*  --   --   --   HGB 10.0* 9.0* 8.8* 8.8*  HCT 34.5* 30.3* 28.6* 29.4*  MCV 90.3 88.6 86.9 87.2  PLT 225 200 144*  154   Basic Metabolic Panel: Recent Labs  Lab 06/12/23 2028 06/13/23 0435 06/14/23 0558 06/14/23 2004 06/15/23 0518  NA 135 133* 133* 135 136  K 3.5 3.5 2.9* 3.4* 2.9*  CL 97* 101 96* 96* 96*  CO2 11* 17* 27 32 32  GLUCOSE 99 151* 94 105* 98  BUN 7 6 <5* <5* <5*  CREATININE 0.89 0.68 0.50 0.45 0.46  CALCIUM 8.9 8.6* 8.8* 9.2 9.3  MG  --   --  1.9  --   --   PHOS  --   --  <  1.0* 2.1*  --    GFR: Estimated Creatinine Clearance: 88.6 mL/min (by C-G formula based on SCr of 0.46 mg/dL). Liver Function Tests: Recent Labs  Lab 06/12/23 2028 06/13/23 0435 06/14/23 0558 06/15/23 0518  AST 127* 90* 59* 95*  ALT 77* 64* 42 46*  ALKPHOS 103 93 72 74  BILITOT 2.5* 2.5* 1.6* 1.3*  PROT 8.7* 7.7 6.3* 6.5  ALBUMIN 5.2* 4.6 3.8 3.6   Recent Labs  Lab 06/12/23 2028 06/13/23 0829 06/14/23 0558  LIPASE 228* 434* 704*  CBG: Recent Labs  Lab 06/13/23 2204  GLUCAP 102*  No results found for this or any previous visit (from the past 240 hour(s)).  Antimicrobials: Anti-infectives (From admission, onward)    None      Culture/Microbiology    Component Value Date/Time   SDES BLOOD LEFT ANTECUBITAL 10/18/2022 0821   SPECREQUEST  10/18/2022 0821    BOTTLES DRAWN AEROBIC AND ANAEROBIC Blood Culture adequate volume   CULT  10/18/2022 0821    NO GROWTH 5 DAYS Performed at [facility], [address] [place], [place] [address]    REPTSTATUS 10/23/2022 FINAL 10/18/2022 6045  Radiology Studies: No results found.   LOS: 2 days   Lanae Boast, MD [facility]  06/15/2023, 8:58 AM

## 2023-06-15 NOTE — Discharge Instructions (Signed)
                  Intensive Outpatient Programs  [facility]    [facility] [address]     [address] [place],  [place]     [place], [place] [phone]      [phone]  [facility]   [facility]  (Inpatient and outpatient)  [phone] (Suboxone and Methadone) [address]           [phone]           [facility]    [facility] - Intensive Outpatient [address]     [address] [place], [place] [address]     [place], [place]  [phone]      [phone]  [facility] (Outpatient, Inpatient, Chemical  [facility] (Groups and [facility]idental) (insurance only) [phone]    [place], [place]          [phone]       [facility]    [facility] (for caregivers and family) [address]     [address] [place], [place]     [place], [place] [phone]      [phone]  Residential Treatment Programs  [facility]  [facility](2 years) Residential: 90 days)  [facility] [address]      [address] [place], [place]     [place], [place] [phone]      [phone] or [phone]  [facility]    [facility] [address]      [address] [place], [place]     [place], [place] [phone]      [phone]  [facility]   [facility] ([facility]) [address]     [address] [place], [place] [address]     [place], [place] [phone]      [phone] Admissions: 8am-3pm M-F  [facility]: Residential Program (90 Days)              [facility]: [facility]  [place], [place]     [place],   [phone] or [phone]    (Walk in Hours over the weekend or by referral)   [facility]: [facility]:[phone] (for crisis  response 24 hours a day) 

## 2023-06-16 ENCOUNTER — Observation Stay
Admission: EM | Admit: 2023-06-16 | Discharge: 2023-06-18 | Disposition: A | Discharge status: Home / Self Care | Payer: 59 | Attending: Emergency Medicine | Admitting: Emergency Medicine

## 2023-06-16 ENCOUNTER — Other Ambulatory Visit: Payer: Self-pay

## 2023-06-16 ENCOUNTER — Emergency Department: Payer: 59

## 2023-06-16 DIAGNOSIS — E876 Hypokalemia: Secondary | ICD-10-CM | POA: Diagnosis not present

## 2023-06-16 DIAGNOSIS — K219 Gastro-esophageal reflux disease without esophagitis: Secondary | ICD-10-CM

## 2023-06-16 DIAGNOSIS — Z79899 Other long term (current) drug therapy: Secondary | ICD-10-CM | POA: Diagnosis not present

## 2023-06-16 DIAGNOSIS — E871 Hypo-osmolality and hyponatremia: Secondary | ICD-10-CM | POA: Diagnosis not present

## 2023-06-16 DIAGNOSIS — I10 Essential (primary) hypertension: Secondary | ICD-10-CM | POA: Diagnosis present

## 2023-06-16 DIAGNOSIS — R609 Edema, unspecified: Secondary | ICD-10-CM | POA: Diagnosis not present

## 2023-06-16 DIAGNOSIS — F10239 Alcohol dependence with withdrawal, unspecified: Secondary | ICD-10-CM | POA: Diagnosis not present

## 2023-06-16 DIAGNOSIS — F909 Attention-deficit hyperactivity disorder, unspecified type: Secondary | ICD-10-CM | POA: Insufficient documentation

## 2023-06-16 DIAGNOSIS — R569 Unspecified convulsions: Secondary | ICD-10-CM | POA: Diagnosis not present

## 2023-06-16 DIAGNOSIS — F10939 Alcohol use, unspecified with withdrawal, unspecified: Secondary | ICD-10-CM

## 2023-06-16 DIAGNOSIS — F17200 Nicotine dependence, unspecified, uncomplicated: Secondary | ICD-10-CM | POA: Insufficient documentation

## 2023-06-16 DIAGNOSIS — F1020 Alcohol dependence, uncomplicated: Secondary | ICD-10-CM

## 2023-06-16 DIAGNOSIS — K852 Alcohol induced acute pancreatitis without necrosis or infection: Secondary | ICD-10-CM | POA: Diagnosis not present

## 2023-06-16 DIAGNOSIS — F10231 Alcohol dependence with withdrawal delirium: Principal | ICD-10-CM | POA: Insufficient documentation

## 2023-06-16 DIAGNOSIS — R000 Tachycardia, unspecified: Secondary | ICD-10-CM | POA: Diagnosis not present

## 2023-06-16 LAB — CBC
HCT: 27.3 % — ABNORMAL LOW (ref 36.0–46.0)
HCT: 27.2 % — ABNORMAL LOW (ref 36.0–46.0)
Hemoglobin: 8.3 g/dL — ABNORMAL LOW (ref 12.0–15.0)
Hemoglobin: 8.3 g/dL — ABNORMAL LOW (ref 12.0–15.0)
MCH: 26.6 pg (ref 26.0–34.0)
MCH: 26.6 pg (ref 26.0–34.0)
MCHC: 30.4 g/dL (ref 30.0–36.0)
MCHC: 30.5 g/dL (ref 30.0–36.0)
MCV: 87.5 fL (ref 80.0–100.0)
MCV: 87.2 fL (ref 80.0–100.0)
Platelets: 171 10*3/uL (ref 150–400)
Platelets: 238 10*3/uL (ref 150–400)
RBC: 3.12 MIL/uL — ABNORMAL LOW (ref 3.87–5.11)
RBC: 3.12 MIL/uL — ABNORMAL LOW (ref 3.87–5.11)
RDW: 15.9 % — ABNORMAL HIGH (ref 11.5–15.5)
RDW: 16.3 % — ABNORMAL HIGH (ref 11.5–15.5)
WBC: 4.8 10*3/uL (ref 4.0–10.5)
WBC: 8.7 10*3/uL (ref 4.0–10.5)
nRBC: 0 % (ref 0.0–0.2)
nRBC: 0 % (ref 0.0–0.2)

## 2023-06-16 LAB — COMPREHENSIVE METABOLIC PANEL
ALT: 42 U/L (ref 0–44)
ALT: 51 U/L — ABNORMAL HIGH (ref 0–44)
AST: 75 U/L — ABNORMAL HIGH (ref 15–41)
AST: 102 U/L — ABNORMAL HIGH (ref 15–41)
Albumin: 3.6 g/dL (ref 3.5–5.0)
Albumin: 4.1 g/dL (ref 3.5–5.0)
Alkaline Phosphatase: 70 U/L (ref 38–126)
Alkaline Phosphatase: 77 U/L (ref 38–126)
Anion gap: 6 (ref 5–15)
Anion gap: 10 (ref 5–15)
BUN: <5 mg/dL — ABNORMAL LOW (ref 6–20)
BUN: <5 mg/dL — ABNORMAL LOW (ref 6–20)
CO2: 30 mmol/L (ref 22–32)
CO2: 25 mmol/L (ref 22–32)
Calcium: 9.4 mg/dL (ref 8.9–10.3)
Calcium: 9.9 mg/dL (ref 8.9–10.3)
Chloride: 98 mmol/L (ref 98–111)
Chloride: 97 mmol/L — ABNORMAL LOW (ref 98–111)
Creatinine, Ser: 0.46 mg/dL (ref 0.44–1.00)
Creatinine, Ser: 0.49 mg/dL (ref 0.44–1.00)
GFR, Estimated: >60 mL/min (ref >60)
GFR, Estimated: >60 mL/min (ref >60)
Glucose, Bld: 117 mg/dL — ABNORMAL HIGH (ref 70–99)
Glucose, Bld: 104 mg/dL — ABNORMAL HIGH (ref 70–99)
Potassium: 3.3 mmol/L — ABNORMAL LOW (ref 3.5–5.1)
Potassium: 3.9 mmol/L (ref 3.5–5.1)
Sodium: 134 mmol/L — ABNORMAL LOW (ref 135–145)
Sodium: 132 mmol/L — ABNORMAL LOW (ref 135–145)
Total Bilirubin: 0.6 mg/dL (ref 0.3–1.2)
Total Bilirubin: 0.7 mg/dL (ref 0.3–1.2)
Total Protein: 6.6 g/dL (ref 6.5–8.1)
Total Protein: 7.3 g/dL (ref 6.5–8.1)

## 2023-06-16 MED ORDER — AMLODIPINE BESYLATE 5 MG PO TABS
5.0000 mg | ORAL_TABLET | Freq: Every day | ORAL | Status: DC
  Administered 2023-06-16: 5 mg via ORAL
  Filled 2023-06-16: qty 1

## 2023-06-16 MED ORDER — POTASSIUM CHLORIDE CRYS ER 20 MEQ PO TBCR
40.0000 meq | EXTENDED_RELEASE_TABLET | Freq: Once | ORAL | Status: AC
  Administered 2023-06-16: 40 meq via ORAL
  Filled 2023-06-16: qty 2

## 2023-06-16 MED ORDER — FOLIC ACID 1 MG PO TABS: 1.0000 mg | ORAL_TABLET | Freq: Every day | ORAL | 0 refills | Status: AC

## 2023-06-16 MED ORDER — THIAMINE HCL 100 MG PO TABS: 100.0000 mg | ORAL_TABLET | Freq: Every day | ORAL | 0 refills | Status: AC

## 2023-06-16 MED ORDER — POLYVINYL ALCOHOL 1.4 % OP SOLN: 2.0000 [drp] | OPHTHALMIC | 0 refills | Status: AC | PRN

## 2023-06-16 MED ORDER — HYDRALAZINE HCL 10 MG PO TABS: 10.0000 mg | ORAL_TABLET | Freq: Four times a day (QID) | ORAL | Status: DC | PRN

## 2023-06-16 MED ORDER — LACTATED RINGERS IV BOLUS
1000.0000 mL | Freq: Once | INTRAVENOUS | Status: AC
  Administered 2023-06-16: 1000 mL via INTRAVENOUS

## 2023-06-16 MED ORDER — PHENOBARBITAL SODIUM 65 MG/ML IJ SOLN
130.0000 mg | Freq: Once | INTRAMUSCULAR | Status: AC
  Administered 2023-06-16: 130 mg via INTRAVENOUS
  Filled 2023-06-16: qty 2

## 2023-06-16 MED ORDER — POTASSIUM CHLORIDE CRYS ER 20 MEQ PO TBCR
20.0000 meq | EXTENDED_RELEASE_TABLET | Freq: Every day | ORAL | Status: DC
  Administered 2023-06-16: 20 meq via ORAL
  Filled 2023-06-16: qty 1

## 2023-06-16 MED ORDER — HYDROXYZINE HCL 10 MG PO TABS: 10.0000 mg | ORAL_TABLET | Freq: Three times a day (TID) | ORAL | Status: DC | PRN

## 2023-06-16 NOTE — ED Provider Notes (Signed)
[facility] Provider Note    Event Date/Time   First MD Initiated Contact with Patient 06/16/23 2258     (approximate)   History   Seizures   HPI  Gina Rich is a 37 y.o. female who presents to the ED for evaluation of Seizures   I review medical dc summary form this morning. Admitted for 4 days due to atypical chest pain, EtOH withdrawals, pancreatitis.   Patient presents from home for the ration of multiple reported seizures today.  Reports 3 seizures after being discharged.  No chest pain.  Does report headache after these and reports feeling somewhat tired.  She typically drinks about 1/2 - 1 gallon of vodka per day.  Last meaningful drink was a week ago.  After discharge and getting home she reports having just have to shop for seizures began.  Physical Exam   Triage Vital Signs: ED Triage Vitals [06/16/23 2142]  Enc Vitals Group     BP (!) 123/98     Pulse Rate 98     Resp 16     Temp 98.2 F (36.8 C)     Temp Source Oral     SpO2 100 %     Weight      Height      Head Circumference      Peak Flow      Pain Score 10     Pain Loc      Pain Edu?      Excl. in GC?     Most recent vital signs: Vitals:   06/17/23 0100 06/17/23 0301  BP: 114/89 (!) 124/94  Pulse: 82 89  Resp: 20 16  Temp:  99.4 F (37.4 C)  SpO2: 100% 100%    General: Awake, no distress.  CV:  Good peripheral perfusion.  Resp:  Normal effort.  Abd:  No distention.  MSK:  No deformity noted.  Neuro:  No focal deficits appreciated. Cranial nerves II through XII intact 5/5 strength and sensation in all 4 extremities Other:     ED Results / Procedures / Treatments   Labs (all labs ordered are listed, but only abnormal results are displayed) Labs Reviewed  CBC - Abnormal; Notable for the following components:      Result Value   RBC 3.12 (*)    Hemoglobin 8.3 (*)    HCT 27.2 (*)    RDW 16.3 (*)    All other components within normal limits   COMPREHENSIVE METABOLIC PANEL - Abnormal; Notable for the following components:   Sodium 132 (*)    Chloride 97 (*)    Glucose, Bld 104 (*)    BUN <5 (*)    AST 102 (*)    ALT 51 (*)    All other components within normal limits  URINALYSIS, ROUTINE W REFLEX MICROSCOPIC - Abnormal; Notable for the following components:   Color, Urine COLORLESS (*)    APPearance CLEAR (*)    Specific Gravity, Urine 1.003 (*)    pH 9.0 (*)    Ketones, ur 5 (*)    All other components within normal limits  CBC - Abnormal; Notable for the following components:   RBC 2.98 (*)    Hemoglobin 8.0 (*)    HCT 25.7 (*)    RDW 16.5 (*)    nRBC 0.5 (*)    All other components within normal limits  BASIC METABOLIC PANEL - Abnormal; Notable for the following components:   Sodium 132 (*)  Chloride 97 (*)    BUN <5 (*)    All other components within normal limits  ETHANOL    EKG   RADIOLOGY CT head interpreted by me without evidence of acute intracranial pathology  Official radiology report(s): CT HEAD WO CONTRAST ( )  Result Date: 06/16/2023 CLINICAL DATA:  Witnessed seizures x2 EXAM: CT HEAD WITHOUT CONTRAST TECHNIQUE: Contiguous axial images were obtained from the base of the skull through the vertex without intravenous contrast. RADIATION DOSE REDUCTION: This exam was performed according to the departmental dose-optimization program which includes automated exposure control, adjustment of the mA and/or kV according to patient size and/or use of iterative reconstruction technique. COMPARISON:  11/21/2021 FINDINGS: Brain: No evidence of acute infarction, hemorrhage, hydrocephalus, extra-axial collection or mass lesion/mass effect. Vascular: No hyperdense vessel or unexpected calcification. Skull: Normal. Negative for fracture or focal lesion. Sinuses/Orbits: The visualized paranasal sinuses are essentially clear. The mastoid air cells are unopacified. Other: None. IMPRESSION: Normal head CT.  Electronically Signed   By: Charline Bills M.D.   On: 06/16/2023 23:17    PROCEDURES and INTERVENTIONS:  .1-3 Lead EKG Interpretation  Performed by: Delton Prairie, MD Authorized by: Delton Prairie, MD     Interpretation: normal     ECG rate:  80   ECG rate assessment: normal     Rhythm: sinus rhythm     Ectopy: none     Conduction: normal     Medications  amLODipine (NORVASC) tablet 5 mg (has no administration in time range)  pantoprazole (PROTONIX) EC tablet 40 mg (has no administration in time range)  folic acid (FOLVITE) tablet 1 mg (has no administration in time range)  naltrexone (DEPADE) tablet 50 mg (has no administration in time range)  polyvinyl alcohol (LIQUIFILM TEARS) 1.4 % ophthalmic solution 2 drop (has no administration in time range)  enoxaparin (LOVENOX) injection 40 mg (has no administration in time range)  0.9 %  sodium chloride infusion ( Intravenous New Bag/Given 06/17/23 0303)  acetaminophen (TYLENOL) tablet 650 mg (650 mg Oral Given 06/17/23 0303)    Or  acetaminophen (TYLENOL) suppository 650 mg ( Rectal See Alternative 06/17/23 0303)  traZODone (DESYREL) tablet 25 mg (25 mg Oral Given 06/17/23 0303)  magnesium hydroxide (MILK OF MAGNESIA) suspension 30 mL (has no administration in time range)  ondansetron (ZOFRAN) tablet 4 mg (4 mg Oral Given 06/17/23 0303)    Or  ondansetron (ZOFRAN) injection 4 mg ( Intravenous See Alternative 06/17/23 0303)  LORazepam (ATIVAN) tablet 1-4 mg (1 mg Oral Given 06/17/23 0321)    Or  LORazepam (ATIVAN) injection 1-4 mg ( Intravenous See Alternative 06/17/23 0321)  thiamine (VITAMIN B1) tablet 100 mg (has no administration in time range)    Or  thiamine (VITAMIN B1) injection 100 mg (has no administration in time range)  folic acid (FOLVITE) tablet 1 mg (has no administration in time range)  multivitamin with minerals tablet 1 tablet (has no administration in time range)  pneumococcal 20-valent conjugate vaccine (PREVNAR 20) injection  0.5 mL (has no administration in time range)  PHENObarbital (LUMINAL) injection 130 mg (130 mg Intravenous Given 06/16/23 2331)  lactated ringers bolus 1,000 mL (0 mLs Intravenous Stopped 06/17/23 0108)     IMPRESSION / MDM / ASSESSMENT AND PLAN / ED COURSE  I reviewed the triage vital signs and the nursing notes.  Differential diagnosis includes, but is not limited to, cardiogenic dysrhythmia, seizure, pseudoseizure  {Patient presents with symptoms of an acute illness or injury that is potentially  life-threatening.  Patient presents with alcohol withdrawal seizures requiring medical admission.  Neurologically intact without signs of significant trauma.  Provide phenobarbital and no recurrence of seizures in the ED.  Ketones in the urine but no infectious features.  Chronic normocytic anemia without indication for transfusion.  Reassuring metabolic panel.  Consult with medicine for admission  Clinical Course as of 06/17/23 0411  Mon Jun 17, 2023  0045 reassessed [DS]    Clinical Course User Index [DS] Delton Prairie, MD     FINAL CLINICAL IMPRESSION(S) / ED DIAGNOSES   Final diagnoses:  Seizure (HCC)  Alcohol withdrawal seizure with complication [facility])     Rx / DC Orders   ED Discharge Orders     None        Note:  This document was prepared using Dragon voice recognition software and may include unintentional dictation errors.   Delton Prairie, MD 06/17/23 [phone]

## 2023-06-16 NOTE — Discharge Summary (Signed)
Physician Discharge Summary  Gina Rich ZOX:[id number] DOB: [birth year] DOA: 06/12/2023  PCP: Jenell Milliner, MD  Admit date: 06/12/2023 Discharge date: 06/16/2023 Recommendations for Outpatient Follow-up:  Follow up with PCP in 1 weeks-call for appointment Please obtain BMP/CBC in one week  Discharge Dispo: HOME Discharge Condition: Stable Code Status:   Code Status: Full Code Diet recommendation:  Diet Order             DIET SOFT Room service appropriate? Yes; Fluid consistency: Thin  Diet effective now                    Brief/Interim Summary: 37 y.o.f w/ history of ADHD, hypertension, alcohol abuse and dependence with polysubstance abuse including tobacco and marijuana presented with complaint of epigastric, left upper quadrant abdominal pain for the last day with nausea and vomiting Her last alcoholic drink was a couple of days ago.  Also complained of dark stool, occasional bleeding from her hemorrhoids, mild diarrhea,No report of fever, chills   In ED: Vitals stable, labs lipase 228 AST 127 ALT 77, TB 2.5 troponin 2, 3, CBC with normocytic anemia hemoglobin 10 g  Urine pregnancy test was negative.  UA showed with 80 ketones and was otherwise unremarkable.  Alcohol level <10. Urine drug screen was positive for cannabinoid and opiates. EKG : Normal sinus rhythm with a rate of 77 Imaging: CT abdominal pelvis with contrast:acute pancreatitis, hepatic steatosis with no pseudocyst. Patient admitted for further management. Continue on aggressive IV fluid hydration pain management, initially n.p.o. changed to clear liquid diet and slowly advanced At this time she is tolerating diet alert awake oriented x 4 pain controlled.  We extensively discussed about alcohol cessation   Discharge Diagnoses:  Principal Problem:   Acute pancreatitis Active Problems:   Elevated LFTs   Alcohol abuse   Essential hypertension   Depression   Polysubstance abuse (HCC)  Acute alcohol induced  pancreatitis Alcoholic hepatitis  Hepatic steatosis: Patient does binge alcohol drinking, lab with transaminitis level fluctuating, CT abdomen on admission showing acute pancreatitis without pseudocyst.  Vital stable, labs shows LFTs fluctuating and lower than admission value, bicarb at 32 calcium normal WBC platelet count normal.  Continue with  ivf, PPI, antiemetics, pain management. GI has seen and signed off.Overall improving advance to [facility] and [facility] diet Patient tolerated diet no nausea vomiting or been improved, she is requesting for discharge home her LFTs have improved extensively discussed about alcohol cessation  Alcohol abuse at risk of withdrawal: Current no signs of withdrawal, discussed extensively about alcohol cessation  Recent Labs  Lab 06/12/23 2028 06/13/23 0435 06/13/23 0829 06/14/23 0558 06/15/23 0518 06/16/23 0426  AST 127* 90*  --  59* 95* 75*  ALT 77* 64*  --  42 46* 42  ALKPHOS 103 93  --  72 74 70  BILITOT 2.5* 2.5*  --  1.6* 1.3* 0.6  PROT 8.7* 7.7  --  6.3* 6.5 6.6  ALBUMIN 5.2* 4.6  --  3.8 3.6 3.6  LIPASE 228*  --  434* 704*  --   --   PLT 225 200  --  144* 154 171   Hypokalemia Hypophosphatemia Replaced Recent Labs  Lab 06/13/23 0435 06/14/23 0558 06/14/23 2004 06/15/23 0518 06/16/23 0426  K 3.5 2.9* 3.4* 2.9* 3.3*  CALCIUM 8.6* 8.8* 9.2 9.3 9.4  MG  --  1.9  --   --   --   PHOS  --  <1.0* 2.1*  --   --  Dry eyes/blurry vision: No recurrence of symptoms improved with eyedrops   Depression: Mood stable, continue Zoloft  Essential hypertension: HCTZ stopped due to 1.  Controlled on amlodipine 5 mg  Normocytic anemia: appears chronic, monitor hb Recent Labs  Lab 06/12/23 2028 06/13/23 0435 06/14/23 0558 06/15/23 0518 06/16/23 0426  HGB 10.0* 9.0* 8.8* 8.8* 8.3*  HCT 34.5* 30.3* 28.6* 29.4* 27.3*   Consults: GI Subjective: AAOX3 doing well feels ready to go home now  Discharge Exam: Vitals:   06/16/23 0800 06/16/23 1100   BP: (!) 138/111 (!) 131/106  Pulse: (!) 103   Resp: 14   Temp: 98.6 F (37 C)   SpO2: 100%    General: Pt is alert, awake, not in acute distress Cardiovascular: RRR, S1/S2 +, no rubs, no gallops Respiratory: CTA bilaterally, no wheezing, no rhonchi Abdominal: Soft, NT, ND, bowel sounds + Extremities: no edema, no cyanosis  Discharge Instructions  Discharge Instructions     Discharge instructions   Complete by: As directed    Please call call MD or return to ER for similar or worsening recurring problem that brought you to hospital or if any fever,nausea/vomiting,abdominal pain, uncontrolled pain, chest pain,  shortness of breath or any other alarming symptoms.  Please follow-up your doctor as instructed in a week time and call the office for appointment.  Please avoid alcohol, smoking, or any other illicit substance and maintain healthy habits including taking your regular medications as prescribed.  You were cared for by a hospitalist during your hospital stay. If you have any questions about your discharge medications or the care you received while you were in the hospital after you are discharged, you can call the unit and ask to speak with the hospitalist on call if the hospitalist that took care of you is not available.  Once you are discharged, your primary care physician will handle any further medical issues. Please note that NO REFILLS for any discharge medications will be authorized once you are discharged, as it is imperative that you return to your primary care physician (or establish a relationship with a primary care physician if you do not have one) for your aftercare needs so that they can reassess your need for medications and monitor your lab values   Increase activity slowly   Complete by: As directed       Allergies as of 06/16/2023       Reactions   Almond Oil Anaphylaxis   Other    apples   Justicia Adhatoda Itching        Medication List     STOP  taking these medications    hydrochlorothiazide 25 MG tablet Commonly known as: HYDRODIURIL       TAKE these medications    amLODipine 5 MG tablet Commonly known as: NORVASC Take 5 mg by mouth daily.   folic acid 1 MG tablet Commonly known as: FOLVITE Take 1 tablet (1 mg total) by mouth daily.   lisdexamfetamine 20 MG capsule Commonly known as: VYVANSE Take 20 mg by mouth 2 (two) times daily.   naltrexone 50 MG tablet Commonly known as: DEPADE Take 50 mg by mouth daily.   omeprazole 40 MG capsule Commonly known as: PRILOSEC Take 40 mg by mouth daily.   polyvinyl alcohol 1.4 % ophthalmic solution Commonly known as: LIQUIFILM TEARS Place 2 drops into both eyes as needed for dry eyes.   thiamine 100 MG tablet Commonly known as: VITAMIN B1 Take 1 tablet (100 mg total)  by mouth daily.        Follow-up Information     Jenell Milliner, MD Follow up in 1 week(s).   Specialty: Family Medicine Contact information: [address] [facility] [address] [facility] [place] [address] [phone]                Allergies  Allergen Reactions   Almond Oil Anaphylaxis   Other     apples   Justicia Adhatoda Itching    The results of significant diagnostics from this hospitalization (including imaging, microbiology, ancillary and laboratory) are listed below for reference.    Microbiology: No results found for this or any previous visit (from the past 240 hour(s)).  Procedures/Studies: CT ABDOMEN PELVIS W CONTRAST  Result Date: 06/13/2023 CLINICAL DATA:  Abdominal pain, acute, nonlocalized N/V EXAM: CT ABDOMEN AND PELVIS WITH CONTRAST TECHNIQUE: Multidetector CT imaging of the abdomen and pelvis was performed using the standard protocol following bolus administration of intravenous contrast. RADIATION DOSE REDUCTION: This exam was performed according to the departmental dose-optimization program which includes automated exposure control, adjustment of the mA  and/or kV according to patient size and/or use of iterative reconstruction technique. CONTRAST:  OMNIPAQUE IOHEXOL 300 MG/ML  SOLN COMPARISON:  None Available. FINDINGS: Lower chest: No acute abnormality. Hepatobiliary: The liver is enlarged measuring up to 20 cm with question a Reidel lobe. The hepatic parenchyma is diffusely hypodense compared to the splenic parenchyma consistent with fatty infiltration. No focal liver abnormality. No gallstones, gallbladder wall thickening, or pericholecystic fluid. No biliary dilatation. Pancreas: Fluid density along the proximal mid pancreas. Poorly defined contour of the uncinate process. Vague fat stranding along the pancreatic tail. No main pancreatic ductal dilatation. No pseudocyst formation. Spleen: Normal in size without focal abnormality. Adrenals/Urinary Tract: No adrenal nodule bilaterally. Bilateral kidneys enhance symmetrically. No hydronephrosis. No hydroureter. The urinary bladder is unremarkable. Stomach/Bowel: Stomach is within normal limits. No evidence of bowel wall thickening or dilatation. Appendix appears normal. Vascular/Lymphatic: No abdominal aorta or iliac aneurysm. Mild atherosclerotic plaque of the aorta and its branches. No abdominal, pelvic, or inguinal lymphadenopathy. Reproductive: Uterus and bilateral adnexa are unremarkable. Other: Trace simple free fluid along the pancreas, duodenum, and upper abdomen. No intraperitoneal free gas. No organized fluid collection. Musculoskeletal: No abdominal wall hernia or abnormality. No suspicious lytic or blastic osseous lesions. No acute displaced fracture. IMPRESSION: 1. Acute pancreatitis.  No pseudocyst. 2. Hepatic steatosis. 3. Trace simple free fluid ascites. Electronically Signed   By: Tish Frederickson M.D.   On: 06/13/2023 01:40   DG Chest 2 View  Result Date: 06/12/2023 CLINICAL DATA:  Chest pain. EXAM: CHEST - 2 VIEW COMPARISON:  Chest radiograph dated 10/17/2022. FINDINGS: No focal  consolidation, pleural effusion, or pneumothorax. The cardiac silhouette is within normal limits. No acute osseous pathology. IMPRESSION: No active cardiopulmonary disease. Electronically Signed   By: Elgie Collard M.D.   On: 06/12/2023 20:42    Labs: BNP (last 3 results) No results for input(s): "BNP" in the last 8760 hours. Basic Metabolic Panel: Recent Labs  Lab 06/13/23 0435 06/14/23 0558 06/14/23 2004 06/15/23 0518 06/16/23 0426  NA 133* 133* 135 136 134*  K 3.5 2.9* 3.4* 2.9* 3.3*  CL 101 96* 96* 96* 98  CO2 17* 27 32 32 30  GLUCOSE 151* 94 105* 98 117*  BUN 6 <5* <5* <5* <5*  CREATININE 0.68 0.50 0.45 0.46 0.46  CALCIUM 8.6* 8.8* 9.2 9.3 9.4  MG  --  1.9  --   --   --  PHOS  --  <1.0* 2.1*  --   --    Liver Function Tests: Recent Labs  Lab 06/12/23 2028 06/13/23 0435 06/14/23 0558 06/15/23 0518 06/16/23 0426  AST 127* 90* 59* 95* 75*  ALT 77* 64* 42 46* 42  ALKPHOS 103 93 72 74 70  BILITOT 2.5* 2.5* 1.6* 1.3* 0.6  PROT 8.7* 7.7 6.3* 6.5 6.6  ALBUMIN 5.2* 4.6 3.8 3.6 3.6   Recent Labs  Lab 06/12/23 2028 06/13/23 0829 06/14/23 0558  LIPASE 228* 434* 704*   No results for input(s): "AMMONIA" in the last 168 hours. CBC: Recent Labs  Lab 06/12/23 2028 06/13/23 0435 06/14/23 0558 06/15/23 0518 06/16/23 0426  WBC 10.5 8.4 5.4 5.3 4.8  NEUTROABS 9.4*  --   --   --   --   HGB 10.0* 9.0* 8.8* 8.8* 8.3*  HCT 34.5* 30.3* 28.6* 29.4* 27.3*  MCV 90.3 88.6 86.9 87.2 87.5  PLT 225 200 144* 154 171   Cardiac Enzymes: No results for input(s): "CKTOTAL", "CKMB", "CKMBINDEX", "TROPONINI" in the last 168 hours. BNP: Invalid input(s): "POCBNP" CBG: Recent Labs  Lab 06/13/23 2204  GLUCAP 102*   D-Dimer No results for input(s): "DDIMER" in the last 72 hours. Hgb A1c No results for input(s): "HGBA1C" in the last 72 hours. Lipid Profile No results for input(s): "CHOL", "HDL", "LDLCALC", "TRIG", "CHOLHDL", "LDLDIRECT" in the last 72 hours. Thyroid  function studies No results for input(s): "TSH", "T4TOTAL", "T3FREE", "THYROIDAB" in the last 72 hours.  Invalid input(s): "FREET3" Anemia work up No results for input(s): "VITAMINB12", "FOLATE", "FERRITIN", "TIBC", "IRON", "RETICCTPCT" in the last 72 hours. Urinalysis    Component Value Date/Time   COLORURINE STRAW (A) 06/13/2023 0044   APPEARANCEUR CLEAR (A) 06/13/2023 0044   LABSPEC >1.046 (H) 06/13/2023 0044   PHURINE 5.0 06/13/2023 0044   GLUCOSEU NEGATIVE 06/13/2023 0044   HGBUR NEGATIVE 06/13/2023 0044   BILIRUBINUR NEGATIVE 06/13/2023 0044   KETONESUR 80 (A) 06/13/2023 0044   PROTEINUR 100 (A) 06/13/2023 0044   NITRITE NEGATIVE 06/13/2023 0044   LEUKOCYTESUR NEGATIVE 06/13/2023 0044   Sepsis Labs Recent Labs  Lab 06/13/23 0435 06/14/23 0558 06/15/23 0518 06/16/23 0426  WBC 8.4 5.4 5.3 4.8   Microbiology No results found for this or any previous visit (from the past 240 hour(s)).   Time coordinating discharge: 25 minutes  SIGNED: Lanae Boast, MD  [facility] 06/16/2023, 12:03 PM  If 7PM-7AM, please contact night-coverage [email or web address]

## 2023-06-16 NOTE — ED Triage Notes (Signed)
Pt to ED by EMS from home following 2 witnessed seizures today lasting "a few minutes". Pt has had 1 previous seizure a year ago. Pt reports recently quitting drinking, states she was taking ativan up until 2 days ago for withdrawals. Arrives A+O, VSS, NADN. Pt has swelling to her bottom lip from todays seizures when she bit her lip.

## 2023-06-16 NOTE — Plan of Care (Signed)

## 2023-06-16 NOTE — ED Provider Triage Note (Signed)
Emergency Medicine Provider Triage Evaluation Note  Gina Rich , a 37 y.o. female  was evaluated in triage.  Pt complains of 3 seizures today  Review of Systems  Positive: Injured lower lip Negative: No neurodeficits  Physical Exam  BP (!) 126/95   Pulse (!) 104   Temp 98.2 F (36.8 C) (Oral)   Resp 16   LMP 05/22/2023 (Approximate)   SpO2 100%  Gen:   Awake, no distress   Resp:  Normal effort  MSK:   Moves extremities without difficulty  Other:    Medical Decision Making  Medically screening exam initiated at 10:49 PM.  Appropriate orders placed.  Salama Stuver was informed that the remainder of the evaluation will be completed by another provider, this initial triage assessment does not replace that evaluation, and the importance of remaining in the ED until their evaluation is complete.  Patient with reports of multiple seizures today, well-appearing at this time, noted recent admission and discharge earlier today.   Jene Every, MD 06/16/23 2250

## 2023-06-17 ENCOUNTER — Ambulatory Visit: Payer: 59

## 2023-06-17 DIAGNOSIS — F1020 Alcohol dependence, uncomplicated: Secondary | ICD-10-CM

## 2023-06-17 DIAGNOSIS — F909 Attention-deficit hyperactivity disorder, unspecified type: Secondary | ICD-10-CM | POA: Insufficient documentation

## 2023-06-17 DIAGNOSIS — K219 Gastro-esophageal reflux disease without esophagitis: Secondary | ICD-10-CM | POA: Diagnosis not present

## 2023-06-17 DIAGNOSIS — I10 Essential (primary) hypertension: Secondary | ICD-10-CM | POA: Diagnosis not present

## 2023-06-17 DIAGNOSIS — F10939 Alcohol use, unspecified with withdrawal, unspecified: Secondary | ICD-10-CM

## 2023-06-17 DIAGNOSIS — R569 Unspecified convulsions: Secondary | ICD-10-CM | POA: Diagnosis not present

## 2023-06-17 DIAGNOSIS — F1029 Alcohol dependence with unspecified alcohol-induced disorder: Secondary | ICD-10-CM

## 2023-06-17 LAB — CBC
HCT: 25.7 % — ABNORMAL LOW (ref 36.0–46.0)
Hemoglobin: 8 g/dL — ABNORMAL LOW (ref 12.0–15.0)
MCH: 26.8 pg (ref 26.0–34.0)
MCHC: 31.1 g/dL (ref 30.0–36.0)
MCV: 86.2 fL (ref 80.0–100.0)
Platelets: 260 10*3/uL (ref 150–400)
RBC: 2.98 MIL/uL — ABNORMAL LOW (ref 3.87–5.11)
RDW: 16.5 % — ABNORMAL HIGH (ref 11.5–15.5)
WBC: 8.5 10*3/uL (ref 4.0–10.5)
nRBC: 0.5 % — ABNORMAL HIGH (ref 0.0–0.2)

## 2023-06-17 LAB — BASIC METABOLIC PANEL
Anion gap: 10 (ref 5–15)
BUN: <5 mg/dL — ABNORMAL LOW (ref 6–20)
CO2: 25 mmol/L (ref 22–32)
Calcium: 9.8 mg/dL (ref 8.9–10.3)
Chloride: 97 mmol/L — ABNORMAL LOW (ref 98–111)
Creatinine, Ser: 0.47 mg/dL (ref 0.44–1.00)
GFR, Estimated: >60 mL/min (ref >60)
Glucose, Bld: 95 mg/dL (ref 70–99)
Potassium: 3.7 mmol/L (ref 3.5–5.1)
Sodium: 132 mmol/L — ABNORMAL LOW (ref 135–145)

## 2023-06-17 LAB — URINALYSIS, ROUTINE W REFLEX MICROSCOPIC
Bilirubin Urine: NEGATIVE
Glucose, UA: NEGATIVE mg/dL
Hgb urine dipstick: NEGATIVE
Ketones, ur: 5 mg/dL — AB
Leukocytes,Ua: NEGATIVE
Nitrite: NEGATIVE
Protein, ur: NEGATIVE mg/dL
Specific Gravity, Urine: 1.003 — ABNORMAL LOW (ref 1.005–1.030)
pH: 9 — ABNORMAL HIGH (ref 5.0–8.0)

## 2023-06-17 LAB — ETHANOL: Alcohol, Ethyl (B): <10 mg/dL (ref <10)

## 2023-06-17 MED ORDER — AMLODIPINE BESYLATE 5 MG PO TABS
5.0000 mg | ORAL_TABLET | Freq: Every day | ORAL | Status: DC
  Administered 2023-06-17 – 2023-06-18 (×2): 5 mg via ORAL
  Filled 2023-06-17 (×2): qty 1

## 2023-06-17 MED ORDER — ACETAMINOPHEN 650 MG RE SUPP: 650.0000 mg | Freq: Four times a day (QID) | RECTAL | Status: DC | PRN

## 2023-06-17 MED ORDER — LORAZEPAM 1 MG PO TABS
1.0000 mg | ORAL_TABLET | ORAL | Status: DC | PRN
  Administered 2023-06-17 – 2023-06-18 (×4): 1 mg via ORAL
  Filled 2023-06-17 (×4): qty 1

## 2023-06-17 MED ORDER — THIAMINE HCL 100 MG/ML IJ SOLN
100.0000 mg | Freq: Every day | INTRAMUSCULAR | Status: DC
  Filled 2023-06-17: qty 2

## 2023-06-17 MED ORDER — ADULT MULTIVITAMIN W/MINERALS CH
1.0000 | ORAL_TABLET | Freq: Every day | ORAL | Status: DC
  Administered 2023-06-17 – 2023-06-18 (×2): 1 via ORAL
  Filled 2023-06-17 (×2): qty 1

## 2023-06-17 MED ORDER — THIAMINE HCL 100 MG PO TABS: 100.0000 mg | ORAL_TABLET | Freq: Every day | ORAL | Status: DC

## 2023-06-17 MED ORDER — TRAZODONE HCL 50 MG PO TABS
25.0000 mg | ORAL_TABLET | Freq: Every evening | ORAL | Status: DC | PRN
  Administered 2023-06-17 – 2023-06-18 (×2): 25 mg via ORAL
  Filled 2023-06-17 (×2): qty 1

## 2023-06-17 MED ORDER — THIAMINE MONONITRATE 100 MG PO TABS
100.0000 mg | ORAL_TABLET | Freq: Every day | ORAL | Status: DC
  Administered 2023-06-17 – 2023-06-18 (×2): 100 mg via ORAL
  Filled 2023-06-17 (×2): qty 1

## 2023-06-17 MED ORDER — NALTREXONE HCL 50 MG PO TABS
50.0000 mg | ORAL_TABLET | Freq: Every day | ORAL | Status: DC
  Administered 2023-06-17 – 2023-06-18 (×2): 50 mg via ORAL
  Filled 2023-06-17 (×2): qty 1

## 2023-06-17 MED ORDER — MAGNESIUM HYDROXIDE 400 MG/5ML PO SUSP: 30.0000 mL | Freq: Every day | ORAL | Status: DC | PRN

## 2023-06-17 MED ORDER — SODIUM CHLORIDE 0.9 % IV SOLN: INTRAVENOUS | Status: DC

## 2023-06-17 MED ORDER — ONDANSETRON HCL 4 MG PO TABS
4.0000 mg | ORAL_TABLET | Freq: Four times a day (QID) | ORAL | Status: DC | PRN
  Administered 2023-06-17: 4 mg via ORAL
  Filled 2023-06-17: qty 1

## 2023-06-17 MED ORDER — PNEUMOCOCCAL 20-VAL CONJ VACC 0.5 ML IM SUSY
0.5000 mL | PREFILLED_SYRINGE | INTRAMUSCULAR | Status: DC
  Filled 2023-06-17: qty 0.5

## 2023-06-17 MED ORDER — FOLIC ACID 1 MG PO TABS
1.0000 mg | ORAL_TABLET | Freq: Every day | ORAL | Status: DC
  Administered 2023-06-17 – 2023-06-18 (×2): 1 mg via ORAL
  Filled 2023-06-17 (×2): qty 1

## 2023-06-17 MED ORDER — FOLIC ACID 1 MG PO TABS
1.0000 mg | ORAL_TABLET | Freq: Every day | ORAL | Status: DC
  Filled 2023-06-17: qty 1

## 2023-06-17 MED ORDER — LORAZEPAM 2 MG/ML IJ SOLN: 1.0000 mg | INTRAMUSCULAR | Status: DC | PRN

## 2023-06-17 MED ORDER — ACETAMINOPHEN 325 MG PO TABS
650.0000 mg | ORAL_TABLET | Freq: Four times a day (QID) | ORAL | Status: DC | PRN
  Administered 2023-06-17: 650 mg via ORAL
  Filled 2023-06-17 (×2): qty 2

## 2023-06-17 MED ORDER — PANTOPRAZOLE SODIUM 40 MG PO TBEC
40.0000 mg | DELAYED_RELEASE_TABLET | Freq: Every day | ORAL | Status: DC
  Administered 2023-06-17 – 2023-06-18 (×2): 40 mg via ORAL
  Filled 2023-06-17 (×2): qty 1

## 2023-06-17 MED ORDER — POLYVINYL ALCOHOL 1.4 % OP SOLN: 2.0000 [drp] | OPHTHALMIC | Status: DC | PRN

## 2023-06-17 MED ORDER — MAGIC MOUTHWASH W/LIDOCAINE
5.0000 mL | Freq: Three times a day (TID) | ORAL | Status: DC | PRN
  Administered 2023-06-17: 5 mL via ORAL
  Filled 2023-06-17 (×2): qty 5

## 2023-06-17 MED ORDER — ONDANSETRON HCL 4 MG/2ML IJ SOLN: 4.0000 mg | Freq: Four times a day (QID) | INTRAMUSCULAR | Status: DC | PRN

## 2023-06-17 MED ORDER — ENOXAPARIN SODIUM 40 MG/0.4ML IJ SOSY
40.0000 mg | PREFILLED_SYRINGE | INTRAMUSCULAR | Status: DC
  Administered 2023-06-17 – 2023-06-18 (×2): 40 mg via SUBCUTANEOUS
  Filled 2023-06-17 (×2): qty 0.4

## 2023-06-17 NOTE — Progress Notes (Signed)
   06/17/23 1000  Spiritual Encounters  Type of Visit Initial  Care provided to: Patient  Referral source Patient request  Reason for visit Urgent spiritual support  OnCall Visit No  Spiritual Framework  Presenting Themes Rituals and practive  Patient Stress Factors Major life changes  Interventions  Spiritual Care Interventions Made Established relationship of care and support;Compassionate presence;Reflective listening;Prayer  Spiritual Care Plan  Spiritual Care Issues Still Outstanding No further spiritual care needs at this time (see row info)   Prayer with the patient. Patient is dealing with withdraws from alcohol and is struggling. Patient spoke of not drinking alcohol for the sake of her kids and her own health problems. She said alcohol has cause her those health problem and is a major concern for her. Patient state her fiance is one of the catalyst to her drinking.

## 2023-06-17 NOTE — Assessment & Plan Note (Signed)
-   The patient will be admitted to a medical telemetry observation bed. - She will be placed on seizure precautions. - We will obtain an EEG. - She will be placed on as needed IV Ativan. - Neurology consult to be obtained. - I notified Dr. Iver Nestle about the patient.

## 2023-06-17 NOTE — H&P (Addendum)
[place]   PATIENT NAME: Gina Rich    MR#:  [id number]  DATE OF BIRTH:  [birth year]  DATE OF ADMISSION:  06/16/2023  PRIMARY CARE PHYSICIAN: Jenell Milliner, MD   Patient is coming from: Home  REQUESTING/REFERRING PHYSICIAN: Delton Prairie, MD  CHIEF COMPLAINT:   Chief Complaint  Patient presents with   Seizures    HISTORY OF PRESENT ILLNESS:  Gina Rich is a 37 y.o. female with medical history significant for essential hypertension, ADHD, tobacco and alcohol abuse, who presented to the emergency room with acute onset of recurrent seizure.  She had her first episode at 2:30 PM and then had to have a shot of vodka.  5 hours later she had another episode while attending her brother-in-law's birthday and she had a postictal confusion after that then admitted to having a third episode.  She stated that she tried alcohol detoxification about a year ago when she had seizures.  The patient was recently admitted to the ER for acute pancreatitis from 6/26 till 6/30 yesterday.  She was also managed for atypical chest pain, hypokalemia and hypophosphatemia and alcohol withdrawal.  No fever or chills.  No nausea or vomiting or abdominal pain.  No cough or wheezing or hemoptysis.  No chest pain or palpitations.  She admits to urinary frequency and urgency.  No dysuria, oliguria or hematuria or flank pain.  When she went home she had to have a shot of vodka.  Her usual has been about a gallon per day before she was admitted here.  ED Course: Upper presentation to the emergency room, BP was 125/102 with heart rate of 100 and otherwise normal vital signs.  Labs revealed mild hyponatremia 132 and hypochloremia of 97, AST 102 and ALT 51 with otherwise unremarkable CMP.  CBC showed anemia close to baseline.  UA was negative. EKG as reviewed by me : None Imaging: Noncontrasted head CT scan was normal.  The patient was given 1 L bolus of IV lactated Ringer's and 130 mg of IV phenobarbital.   She will be admitted to a medical telemetry observation bed for further evaluation and management.   PAST MEDICAL HISTORY:   Past Medical History:  Diagnosis Date   ADHD (attention deficit hyperactivity disorder)    Hypertension     PAST SURGICAL HISTORY:  History reviewed. No pertinent surgical history.  SOCIAL HISTORY:   Social History   Tobacco Use   Smoking status: Every Day   Smokeless tobacco: Never  Substance Use Topics   Alcohol use: Yes    Comment: occ    FAMILY HISTORY:   Family History  Problem Relation Age of Onset   Breast cancer Mother    Cervical cancer Mother    Ovarian cancer Mother    Diabetes Mother    Hypertension Mother    Clotting disorder Mother        blood clots in legs or lungs   Sickle cell trait Father    Hypertension Father    Clotting disorder Sister        blood clots in legs or lungs    DRUG ALLERGIES:   Allergies  Allergen Reactions   Almond Oil Anaphylaxis   Other     apples   Justicia Adhatoda Itching    REVIEW OF SYSTEMS:   ROS As per history of present illness. All pertinent systems were reviewed above. Constitutional, HEENT, cardiovascular, respiratory, GI, GU, musculoskeletal, neuro, psychiatric, endocrine, integumentary and hematologic systems were  reviewed and are otherwise negative/unremarkable except for positive findings mentioned above in the HPI.   MEDICATIONS AT HOME:   Prior to Admission medications   Medication Sig Start Date End Date Taking? Authorizing Provider  amLODipine (NORVASC) 5 MG tablet Take 5 mg by mouth daily. 10/31/22 10/31/23 Yes [provider]  folic acid (FOLVITE) 1 MG tablet Take 1 tablet (1 mg total) by mouth daily. 06/16/23 07/16/23 Yes Lanae Boast, MD  lisdexamfetamine (VYVANSE) 20 MG capsule Take 20 mg by mouth 2 (two) times daily.   Yes [provider]  naltrexone (DEPADE) 50 MG tablet Take 50 mg by mouth daily. 03/23/23 08/19/23 Yes [provider]  omeprazole  (PRILOSEC) 40 MG capsule Take 40 mg by mouth daily. 02/15/23 02/15/24 Yes [provider]  polyvinyl alcohol (LIQUIFILM TEARS) 1.4 % ophthalmic solution Place 2 drops into both eyes as needed for dry eyes. 06/16/23  Yes Lanae Boast, MD  thiamine (VITAMIN B1) 100 MG tablet Take 1 tablet (100 mg total) by mouth daily. 06/16/23 07/16/23 Yes Lanae Boast, MD      VITAL SIGNS:  Blood pressure 114/89, pulse 82, temperature 98.2 F (36.8 C), temperature source Oral, resp. rate 20, last menstrual period 05/22/2023, SpO2 100 %.  PHYSICAL EXAMINATION:  Physical Exam  GENERAL:  37 y.o.-year-old African-American female patient lying in the bed with no acute distress.  EYES: Pupils equal, round, reactive to light and accommodation. No scleral icterus. Extraocular muscles intact.  HEENT: Head atraumatic, normocephalic. Oropharynx and nasopharynx clear.  NECK:  Supple, no jugular venous distention. No thyroid enlargement, no tenderness.  LUNGS: Normal breath sounds bilaterally, no wheezing, rales,rhonchi or crepitation. No use of accessory muscles of respiration.  CARDIOVASCULAR: Regular rate and rhythm, S1, S2 normal. No murmurs, rubs, or gallops.  ABDOMEN: Soft, nondistended, nontender. Bowel sounds present. No organomegaly or mass.  EXTREMITIES: No pedal edema, cyanosis, or clubbing.  NEUROLOGIC: Cranial nerves II through XII are intact. Muscle strength 5/5 in all extremities. Sensation intact. Gait not checked.  PSYCHIATRIC: The patient is alert and oriented x 3.  Normal affect and good eye contact. SKIN: No obvious rash, lesion, or ulcer.   LABORATORY PANEL:   CBC Recent Labs  Lab 06/16/23 2203  WBC 8.7  HGB 8.3*  HCT 27.2*  PLT 238   ------------------------------------------------------------------------------------------------------------------  Chemistries  Recent Labs  Lab 06/14/23 0558 06/14/23 2004 06/16/23 2203  NA 133*   < > 132*  K 2.9*   < > 3.9  CL 96*   < > 97*  CO2  27   < > 25  GLUCOSE 94   < > 104*  BUN <5*   < > <5*  CREATININE 0.50   < > 0.49  CALCIUM 8.8*   < > 9.9  MG 1.9  --   --   AST 59*   < > 102*  ALT 42   < > 51*  ALKPHOS 72   < > 77  BILITOT 1.6*   < > 0.7   < > = values in this interval not displayed.   ------------------------------------------------------------------------------------------------------------------  Cardiac Enzymes No results for input(s): "TROPONINI" in the last 168 hours. ------------------------------------------------------------------------------------------------------------------  RADIOLOGY:  CT HEAD WO CONTRAST ( )  Result Date: 06/16/2023 CLINICAL DATA:  Witnessed seizures x2 EXAM: CT HEAD WITHOUT CONTRAST TECHNIQUE: Contiguous axial images were obtained from the base of the skull through the vertex without intravenous contrast. RADIATION DOSE REDUCTION: This exam was performed according to the departmental dose-optimization program which includes  automated exposure control, adjustment of the mA and/or kV according to patient size and/or use of iterative reconstruction technique. COMPARISON:  11/21/2021 FINDINGS: Brain: No evidence of acute infarction, hemorrhage, hydrocephalus, extra-axial collection or mass lesion/mass effect. Vascular: No hyperdense vessel or unexpected calcification. Skull: Normal. Negative for fracture or focal lesion. Sinuses/Orbits: The visualized paranasal sinuses are essentially clear. The mastoid air cells are unopacified. Other: None. IMPRESSION: Normal head CT. Electronically Signed   By: Charline Bills M.D.   On: 06/16/2023 23:17      IMPRESSION AND PLAN:  Assessment and Plan: * Alcohol withdrawal seizure (HCC) - The patient will be admitted to a medical telemetry observation bed. - She will be placed on seizure precautions. - We will obtain an EEG. - She will be placed on as needed IV Ativan. - Neurology consult to be obtained. - I notified Dr. Iver Nestle about the  patient.  Alcohol dependence (HCC) - The patient will be placed on CIWA protocol with as needed IV Ativan. - We will place her on p.o. thiamine, multivitamins and folic acid.  Essential hypertension - We will continue her Norvasc.  GERD without esophagitis - We will continue PPI therapy.  ADHD - Given her seizures we will be holding off Vyvanse.   DVT prophylaxis: Lovenox.  Advanced Care Planning:  Code Status: full code.  Family Communication:  The plan of care was discussed in details with the patient (and family). I answered all questions. The patient agreed to proceed with the above mentioned plan. Further management will depend upon hospital course. Disposition Plan: Back to previous home environment Consults called: Neurology. All the records are reviewed and case discussed with ED provider.  Status is: Observation  I certify that at the time of admission, it is my clinical judgment that the patient will require  hospital care extending less than 2 midnights.                            Dispo: The patient is from: Home              Anticipated d/c is to: Home              Patient currently is not medically stable to d/c.              Difficult to place patient: No  Hannah Beat M.D on 06/17/2023 at 1:48 AM  [facility]   From 7 PM-7 AM, contact night-coverage [email or web address]  CC: Primary care physician; Jenell Milliner, MD

## 2023-06-17 NOTE — Assessment & Plan Note (Signed)
-   Given her seizures we will be holding off Vyvanse.

## 2023-06-17 NOTE — Assessment & Plan Note (Signed)
-   We will continue PPI therapy 

## 2023-06-17 NOTE — Progress Notes (Signed)
Courtesy note- no billing Patient is admitted to hospitalist service for evaluation of alcohol intoxication, withdrawal seizures.She was sleeping when I saw her but upon awakening able to sit on the bed, asking for her breakfast. No hand tremors or confusion noted. She is able to answer me appropriately. Hb low note at 8. Denies any bleeding. Will continue to monitor vitals closely. Continue CIWA protocol. Fall, aspiration and seizure precautions. Further management per hospital course.

## 2023-06-17 NOTE — Assessment & Plan Note (Signed)
-   We will continue her Norvasc. 

## 2023-06-17 NOTE — Assessment & Plan Note (Addendum)
-   The patient will be placed on CIWA protocol with as needed IV Ativan. - We will place her on p.o. thiamine, multivitamins and folic acid.

## 2023-06-18 DIAGNOSIS — K219 Gastro-esophageal reflux disease without esophagitis: Secondary | ICD-10-CM | POA: Diagnosis not present

## 2023-06-18 DIAGNOSIS — E876 Hypokalemia: Secondary | ICD-10-CM

## 2023-06-18 DIAGNOSIS — F10230 Alcohol dependence with withdrawal, uncomplicated: Secondary | ICD-10-CM

## 2023-06-18 DIAGNOSIS — E871 Hypo-osmolality and hyponatremia: Secondary | ICD-10-CM

## 2023-06-18 DIAGNOSIS — I10 Essential (primary) hypertension: Secondary | ICD-10-CM | POA: Diagnosis not present

## 2023-06-18 DIAGNOSIS — R569 Unspecified convulsions: Secondary | ICD-10-CM | POA: Diagnosis not present

## 2023-06-18 LAB — CBC
HCT: 28 % — ABNORMAL LOW (ref 36.0–46.0)
Hemoglobin: 8.4 g/dL — ABNORMAL LOW (ref 12.0–15.0)
MCH: 26.4 pg (ref 26.0–34.0)
MCHC: 30 g/dL (ref 30.0–36.0)
MCV: 88.1 fL (ref 80.0–100.0)
Platelets: 322 K/uL (ref 150–400)
RBC: 3.18 MIL/uL — ABNORMAL LOW (ref 3.87–5.11)
RDW: 16.3 % — ABNORMAL HIGH (ref 11.5–15.5)
WBC: 8.2 K/uL (ref 4.0–10.5)
nRBC: 0 % (ref 0.0–0.2)

## 2023-06-18 LAB — BASIC METABOLIC PANEL
Anion gap: 8 (ref 5–15)
BUN: <5 mg/dL — ABNORMAL LOW (ref 6–20)
CO2: 26 mmol/L (ref 22–32)
Calcium: 9.4 mg/dL (ref 8.9–10.3)
Chloride: 101 mmol/L (ref 98–111)
Creatinine, Ser: 0.6 mg/dL (ref 0.44–1.00)
GFR, Estimated: >60 mL/min (ref >60)
Glucose, Bld: 121 mg/dL — ABNORMAL HIGH (ref 70–99)
Potassium: 3.2 mmol/L — ABNORMAL LOW (ref 3.5–5.1)
Sodium: 135 mmol/L (ref 135–145)

## 2023-06-18 MED ORDER — POTASSIUM CHLORIDE CRYS ER 20 MEQ PO TBCR: 20.0000 meq | EXTENDED_RELEASE_TABLET | Freq: Two times a day (BID) | ORAL | 0 refills | Status: DC

## 2023-06-18 MED ORDER — POTASSIUM CHLORIDE CRYS ER 20 MEQ PO TBCR: 20.0000 meq | EXTENDED_RELEASE_TABLET | Freq: Two times a day (BID) | ORAL | Status: DC

## 2023-06-18 NOTE — Discharge Instructions (Signed)
                  Intensive Outpatient Programs  [facility]    [facility] [address]     [address] [place],  [place]     [place], [place] [phone]      [phone]  [facility]   [facility]  (Inpatient and outpatient)  [phone] (Suboxone and Methadone) [address]           [phone]           [facility]    [facility] - Intensive Outpatient [address]     [address] [place], [place] [address]     [place], [place]  [phone]      [phone]  [facility] (Outpatient, Inpatient, [facility]  [facility] (Groups and [facility]) (insurance only) [phone]    [place], [place]          [phone]       [facility]    [facility] (for caregivers and family) [address]     [address] [place], [place]     [place], [place] [phone]      [phone]  Residential Treatment Programs  [facility]  [facility](2 years) Residential: 90 days)  [facility] [address]      [address] [place], [place]     [place], [place] [phone]      [phone] or [phone]  [facility]    [facility] [address]      [address] [place], [place]     , [place] [phone]      [phone]  [facility]   [facility] ([facility] [address]     [address] [place], [place] [address]     [place], [place] [phone]      [phone] Admissions: 8am-3pm M-F  [facility]: Residential Program (90 Days)              [facility]: [facility]  [place], [place]     [place], [place]  [phone] or [phone]    (Walk in Hours over the weekend or by referral)   [facility]: [facility]:[phone] (for crisis  response 24 hours a day) 

## 2023-06-18 NOTE — TOC Transition Note (Signed)
Transition of Care [facility]) - CM/SW Discharge Note   Patient Details  Name: Gina Rich MRN: [id number] Date of Birth: Apr 08, 1986  [facility]) CM/SW Contact:  Allena Katz, LCSW Phone Number: 06/18/2023, 11:08 AM   Clinical Narrative:   Pt has orders to discharge. Substance use resources added to AVS.          Patient Goals and CMS Choice      Discharge Placement                         Discharge Plan and Services Additional resources added to the After Visit Summary for                                       Social Determinants of Health (SDOH) Interventions SDOH Screenings   Food Insecurity: No Food Insecurity (06/17/2023)  Housing: Low Risk  (06/17/2023)  Recent Concern: Housing - Medium Risk (06/13/2023)  Transportation Needs: Unmet Transportation Needs (06/17/2023)  Utilities: Not At Risk (06/17/2023)  Tobacco Use: High Risk (06/16/2023)     Readmission Risk Interventions     No data to display

## 2023-06-18 NOTE — Discharge Summary (Signed)
Physician Discharge Summary   Patient: Gina Rich MRN: [id number] DOB: 1986/02/08  Admit date:     06/16/2023  Discharge date: 06/18/23  Discharge Physician: Marcelino Duster   PCP: Jenell Milliner, MD   Recommendations at discharge:    PCP in 1 week. Alcohol cessation counseling and resources provided.  Discharge Diagnoses: Principal Problem:   Alcohol withdrawal seizure (HCC) Active Problems:   Alcohol dependence (HCC)   Essential hypertension   GERD without esophagitis   Hypokalemia   ADHD   Hyponatremia  Resolved Problems:   * No resolved hospital problems. *  Hospital Course: Gina Rich is a 37 y.o. female with medical history significant for essential hypertension, ADHD, tobacco and alcohol abuse, who presented to the emergency room with acute onset of recurrent seizure.  She had her first episode at 2:30 PM and then had to have a shot of vodka.  5 hours later she had another episode while attending her brother-in-law's birthday and she had a postictal confusion after that then admitted to having a third episode.  She stated that she tried alcohol detoxification about a year ago when she had seizures.  The patient was recently admitted to the ER for acute pancreatitis from 6/26 till 6/30 yesterday.  She was also managed for atypical chest pain, hypokalemia and hypophosphatemia and alcohol withdrawal. Her usual has been about a gallon per day before she was admitted here.  Patient is placed under CIWA protocol, continued on IV fluids, thiamine, folate, K supplements. She got lidocaine for lip, longue bite sustained during seizures. Patient's symptoms improved, no more episodes of seizures noted. She feels better today, eating well, wishes to go home. Alcohol cessation counseling provided. She understands and agrees with discharge plan.      Consultants: stable Procedures performed: none   Disposition: Home Diet recommendation:  Discharge Diet Orders (From  admission, onward)     Start     Ordered   06/18/23 0000  Diet - low sodium heart healthy        06/18/23 1107           Cardiac diet DISCHARGE MEDICATION: Allergies as of 06/18/2023       Reactions   Almond Oil Anaphylaxis   Justicia Adhatoda Itching        Medication List     TAKE these medications    amLODipine 5 MG tablet Commonly known as: NORVASC Take 5 mg by mouth daily.   folic acid 1 MG tablet Commonly known as: FOLVITE Take 1 tablet (1 mg total) by mouth daily.   lisdexamfetamine 20 MG capsule Commonly known as: VYVANSE Take 20 mg by mouth 2 (two) times daily.   naltrexone 50 MG tablet Commonly known as: DEPADE Take 50 mg by mouth daily.   omeprazole 40 MG capsule Commonly known as: PRILOSEC Take 40 mg by mouth daily.   polyvinyl alcohol 1.4 % ophthalmic solution Commonly known as: LIQUIFILM TEARS Place 2 drops into both eyes as needed for dry eyes.   potassium chloride SA 20 MEQ tablet Commonly known as: KLOR-CON M Take 1 tablet (20 mEq total) by mouth 2 (two) times daily.   thiamine 100 MG tablet Commonly known as: VITAMIN B1 Take 1 tablet (100 mg total) by mouth daily.        Discharge Exam: Filed Weights   06/17/23 0301  Weight: 71.1 kg   General - Young African American female, no acute distress HEENT - PERRLA, EOMI, atraumatic head, non tender sinuses. Lung -  Clear, rales, rhonchi, wheezes. Heart - S1, S2 heard, no murmurs, rubs, no pedal edema Neuro - Alert, awake and oriented x 3, non focal exam. Skin - Warm and dry. Lip swelling, tongue bite noted  Condition at discharge: stable  The results of significant diagnostics from this hospitalization (including imaging, microbiology, ancillary and laboratory) are listed below for reference.   Imaging Studies: CT HEAD WO CONTRAST ( )  Result Date: 06/16/2023 CLINICAL DATA:  Witnessed seizures x2 EXAM: CT HEAD WITHOUT CONTRAST TECHNIQUE: Contiguous axial images were obtained  from the base of the skull through the vertex without intravenous contrast. RADIATION DOSE REDUCTION: This exam was performed according to the departmental dose-optimization program which includes automated exposure control, adjustment of the mA and/or kV according to patient size and/or use of iterative reconstruction technique. COMPARISON:  11/21/2021 FINDINGS: Brain: No evidence of acute infarction, hemorrhage, hydrocephalus, extra-axial collection or mass lesion/mass effect. Vascular: No hyperdense vessel or unexpected calcification. Skull: Normal. Negative for fracture or focal lesion. Sinuses/Orbits: The visualized paranasal sinuses are essentially clear. The mastoid air cells are unopacified. Other: None. IMPRESSION: Normal head CT. Electronically Signed   By: Charline Bills M.D.   On: 06/16/2023 23:17   CT ABDOMEN PELVIS W CONTRAST  Result Date: 06/13/2023 CLINICAL DATA:  Abdominal pain, acute, nonlocalized N/V EXAM: CT ABDOMEN AND PELVIS WITH CONTRAST TECHNIQUE: Multidetector CT imaging of the abdomen and pelvis was performed using the standard protocol following bolus administration of intravenous contrast. RADIATION DOSE REDUCTION: This exam was performed according to the departmental dose-optimization program which includes automated exposure control, adjustment of the mA and/or kV according to patient size and/or use of iterative reconstruction technique. CONTRAST:  OMNIPAQUE IOHEXOL 300 MG/ML  SOLN COMPARISON:  None Available. FINDINGS: Lower chest: No acute abnormality. Hepatobiliary: The liver is enlarged measuring up to 20 cm with question a Reidel lobe. The hepatic parenchyma is diffusely hypodense compared to the splenic parenchyma consistent with fatty infiltration. No focal liver abnormality. No gallstones, gallbladder wall thickening, or pericholecystic fluid. No biliary dilatation. Pancreas: Fluid density along the proximal mid pancreas. Poorly defined contour of the uncinate  process. Vague fat stranding along the pancreatic tail. No main pancreatic ductal dilatation. No pseudocyst formation. Spleen: Normal in size without focal abnormality. Adrenals/Urinary Tract: No adrenal nodule bilaterally. Bilateral kidneys enhance symmetrically. No hydronephrosis. No hydroureter. The urinary bladder is unremarkable. Stomach/Bowel: Stomach is within normal limits. No evidence of bowel wall thickening or dilatation. Appendix appears normal. Vascular/Lymphatic: No abdominal aorta or iliac aneurysm. Mild atherosclerotic plaque of the aorta and its branches. No abdominal, pelvic, or inguinal lymphadenopathy. Reproductive: Uterus and bilateral adnexa are unremarkable. Other: Trace simple free fluid along the pancreas, duodenum, and upper abdomen. No intraperitoneal free gas. No organized fluid collection. Musculoskeletal: No abdominal wall hernia or abnormality. No suspicious lytic or blastic osseous lesions. No acute displaced fracture. IMPRESSION: 1. Acute pancreatitis.  No pseudocyst. 2. Hepatic steatosis. 3. Trace simple free fluid ascites. Electronically Signed   By: Tish Frederickson M.D.   On: 06/13/2023 01:40   DG Chest 2 View  Result Date: 06/12/2023 CLINICAL DATA:  Chest pain. EXAM: CHEST - 2 VIEW COMPARISON:  Chest radiograph dated 10/17/2022. FINDINGS: No focal consolidation, pleural effusion, or pneumothorax. The cardiac silhouette is within normal limits. No acute osseous pathology. IMPRESSION: No active cardiopulmonary disease. Electronically Signed   By: Elgie Collard M.D.   On: 06/12/2023 20:42    Microbiology: Results for orders placed or performed during the hospital encounter of  10/15/22  Urine Culture     Status: Abnormal   Collection Time: 10/15/22  2:52 AM   Specimen: Urine, Clean Catch  Result Value Ref Range Status   Specimen Description   Final    URINE, CLEAN CATCH Performed at [facility], [address] [place], [place] [address]    Special  Requests   Final    NONE Performed at [facility], [address] [place], [place] [address]    Culture >=100,000 COLONIES/mL ESCHERICHIA COLI (A)  Final   Report Status 10/17/2022 FINAL  Final   Organism ID, Bacteria ESCHERICHIA COLI (A)  Final      Susceptibility   Escherichia coli - MIC*    AMPICILLIN >=32 RESISTANT Resistant     CEFAZOLIN <=4 SENSITIVE Sensitive     CEFEPIME <=0.12 SENSITIVE Sensitive     CEFTRIAXONE <=0.25 SENSITIVE Sensitive     CIPROFLOXACIN <=0.25 SENSITIVE Sensitive     GENTAMICIN <=1 SENSITIVE Sensitive     IMIPENEM <=0.25 SENSITIVE Sensitive     NITROFURANTOIN <=16 SENSITIVE Sensitive     TRIMETH/SULFA >=320 RESISTANT Resistant     AMPICILLIN/SULBACTAM >=32 RESISTANT Resistant     PIP/TAZO <=4 SENSITIVE Sensitive     * >=100,000 COLONIES/mL ESCHERICHIA COLI  Resp Panel by RT-PCR (Flu A&B, Covid) Anterior Nasal Swab     Status: None   Collection Time: 10/15/22  2:55 AM   Specimen: Anterior Nasal Swab  Result Value Ref Range Status   SARS Coronavirus 2 by RT PCR NEGATIVE NEGATIVE Final    Comment: (NOTE) SARS-CoV-2 target nucleic acids are NOT DETECTED.  The SARS-CoV-2 RNA is generally detectable in upper respiratory specimens during the acute phase of infection. The lowest concentration of SARS-CoV-2 viral copies this assay can detect is 138 copies/mL. A negative result does not preclude SARS-Cov-2 infection and should not be used as the sole basis for treatment or other patient management decisions. A negative result may occur with  improper specimen collection/handling, submission of specimen other than nasopharyngeal swab, presence of viral mutation(s) within the areas targeted by this assay, and inadequate number of viral copies(<138 copies/mL). A negative result must be combined with clinical observations, patient history, and epidemiological information. The expected result is Negative.  Fact Sheet for Patients:   BloggerCourse.com  Fact Sheet for Healthcare Providers:  SeriousBroker.it  This test is no t yet approved or cleared by the [place] FDA and  has been authorized for detection and/or diagnosis of SARS-CoV-2 by FDA under an Emergency Use Authorization (EUA). This EUA will remain  in effect (meaning this test can be used) for the duration of the COVID-19 declaration under Section 564(b)(1) of the Act, 21 U.S.C.section 360bbb-3(b)(1), unless the authorization is terminated  or revoked sooner.       Influenza A by PCR NEGATIVE NEGATIVE Final   Influenza B by PCR NEGATIVE NEGATIVE Final    Comment: (NOTE) The Xpert Xpress SARS-CoV-2/FLU/RSV plus assay is intended as an aid in the diagnosis of influenza from Nasopharyngeal swab specimens and should not be used as a sole basis for treatment. Nasal washings and aspirates are unacceptable for Xpert Xpress SARS-CoV-2/FLU/RSV testing.  Fact Sheet for Patients: BloggerCourse.com  Fact Sheet for Healthcare Providers: SeriousBroker.it  This test is not yet approved or cleared by the [place] FDA and has been authorized for detection and/or diagnosis of SARS-CoV-2 by FDA under an Emergency Use Authorization (EUA). This EUA will remain in effect (meaning this test can be used)  for the duration of the COVID-19 declaration under Section 564(b)(1) of the Act, 21 U.S.C. section 360bbb-3(b)(1), unless the authorization is terminated or revoked.  Performed at [facility], [address], [place], [place] [address]   Blood Culture (routine x 2)     Status: Abnormal   Collection Time: 10/15/22  6:44 AM   Specimen: BLOOD RIGHT ARM  Result Value Ref Range Status   Specimen Description   Final    BLOOD RIGHT ARM Performed at [facility], [address] [place], [place] [address]    Special Requests   Final     BOTTLES DRAWN AEROBIC AND ANAEROBIC Blood Culture results may not be optimal due to an excessive volume of blood received in culture bottles Performed at [facility], [address], [place], [place] [address]    Culture  Setup Time   Final    GRAM NEGATIVE RODS IN BOTH AEROBIC AND ANAEROBIC BOTTLES GRAM STAIN REVIEWED-AGREE WITH RESULT CRITICAL VALUE NOTED.  VALUE IS CONSISTENT WITH PREVIOUSLY REPORTED AND CALLED VALUE. Performed at [facility], [address] [place], [place] [address]    Culture (A)  Final    ESCHERICHIA COLI SUSCEPTIBILITIES PERFORMED ON PREVIOUS CULTURE WITHIN THE LAST 5 DAYS. Performed at [facility], [address], [place], [place] [address]    Report Status 10/19/2022 FINAL  Final  Blood Culture (routine x 2)     Status: Abnormal   Collection Time: 10/15/22  6:44 AM   Specimen: BLOOD  Result Value Ref Range Status   Specimen Description   Final    BLOOD RIGHT FA Performed at [facility], [address] [place], [place] [address]    Special Requests   Final    BOTTLES DRAWN AEROBIC AND ANAEROBIC Blood Culture adequate volume Performed at [facility], [address] [place], [place] [address]    Culture  Setup Time   Final    GRAM NEGATIVE RODS IN BOTH AEROBIC AND ANAEROBIC BOTTLES CRITICAL RESULT CALLED TO, READ BACK BY AND VERIFIED WITH: PHARMD RODNEY GRUBB AT 2133 10/15/2022 GAA Organism ID to follow Performed at [facility], [address] [place], [place] [address]    Culture ESCHERICHIA COLI (A)  Final   Report Status 10/18/2022 FINAL  Final   Organism ID, Bacteria ESCHERICHIA COLI  Final      Susceptibility   Escherichia coli - MIC*    AMPICILLIN >=32 RESISTANT Resistant     CEFAZOLIN <=4 SENSITIVE Sensitive     CEFEPIME <=0.12 SENSITIVE Sensitive     CEFTAZIDIME <=1 SENSITIVE Sensitive     CEFTRIAXONE <=0.25 SENSITIVE Sensitive     CIPROFLOXACIN <=0.25 SENSITIVE Sensitive      GENTAMICIN <=1 SENSITIVE Sensitive     IMIPENEM <=0.25 SENSITIVE Sensitive     TRIMETH/SULFA >=320 RESISTANT Resistant     AMPICILLIN/SULBACTAM 16 INTERMEDIATE Intermediate     PIP/TAZO <=4 SENSITIVE Sensitive     * ESCHERICHIA COLI  Blood Culture ID Panel (Reflexed)     Status: Abnormal   Collection Time: 10/15/22  6:44 AM  Result Value Ref Range Status   Enterococcus faecalis NOT DETECTED NOT DETECTED Final   Enterococcus Faecium NOT DETECTED NOT DETECTED Final   Listeria monocytogenes NOT DETECTED NOT DETECTED Final   Staphylococcus species NOT DETECTED NOT DETECTED Final   Staphylococcus aureus (BCID) NOT DETECTED NOT DETECTED Final   Staphylococcus epidermidis NOT DETECTED NOT DETECTED Final   Staphylococcus lugdunensis NOT DETECTED  NOT DETECTED Final   Streptococcus species NOT DETECTED NOT DETECTED Final   Streptococcus agalactiae NOT DETECTED NOT DETECTED Final   Streptococcus pneumoniae NOT DETECTED NOT DETECTED Final   Streptococcus pyogenes NOT DETECTED NOT DETECTED Final   A.calcoaceticus-baumannii NOT DETECTED NOT DETECTED Final   Bacteroides fragilis NOT DETECTED NOT DETECTED Final   Enterobacterales DETECTED (A) NOT DETECTED Final    Comment: Enterobacterales represent a large order of gram negative bacteria, not a single organism. CRITICAL RESULT CALLED TO, READ BACK BY AND VERIFIED WITH: PHARMD RODNEY GRUBB AT 2133 10/15/2022 GAA    Enterobacter cloacae complex NOT DETECTED NOT DETECTED Final   Escherichia coli DETECTED (A) NOT DETECTED Final    Comment: CRITICAL RESULT CALLED TO, READ BACK BY AND VERIFIED WITH: PHARMD RODNEY GRUBB AT 2133 10/15/2022 GAA    Klebsiella aerogenes NOT DETECTED NOT DETECTED Final   Klebsiella oxytoca NOT DETECTED NOT DETECTED Final   Klebsiella pneumoniae NOT DETECTED NOT DETECTED Final   Proteus species NOT DETECTED NOT DETECTED Final   Salmonella species NOT DETECTED NOT DETECTED Final   Serratia marcescens NOT DETECTED NOT  DETECTED Final   Haemophilus influenzae NOT DETECTED NOT DETECTED Final   Neisseria meningitidis NOT DETECTED NOT DETECTED Final   Pseudomonas aeruginosa NOT DETECTED NOT DETECTED Final   Stenotrophomonas maltophilia NOT DETECTED NOT DETECTED Final   Candida albicans NOT DETECTED NOT DETECTED Final   Candida auris NOT DETECTED NOT DETECTED Final   Candida glabrata NOT DETECTED NOT DETECTED Final   Candida krusei NOT DETECTED NOT DETECTED Final   Candida parapsilosis NOT DETECTED NOT DETECTED Final   Candida tropicalis NOT DETECTED NOT DETECTED Final   Cryptococcus neoformans/gattii NOT DETECTED NOT DETECTED Final   CTX-M ESBL NOT DETECTED NOT DETECTED Final   Carbapenem resistance IMP NOT DETECTED NOT DETECTED Final   Carbapenem resistance KPC NOT DETECTED NOT DETECTED Final   Carbapenem resistance NDM NOT DETECTED NOT DETECTED Final   Carbapenem resist OXA 48 LIKE NOT DETECTED NOT DETECTED Final   Carbapenem resistance VIM NOT DETECTED NOT DETECTED Final    Comment: Performed at [facility], [address] [place], [place] [address]  SARS Coronavirus 2 by RT PCR (hospital order, performed in [facility] hospital lab) *cepheid single result test* Anterior Nasal Swab     Status: None   Collection Time: 10/15/22 11:24 AM   Specimen: Anterior Nasal Swab  Result Value Ref Range Status   SARS Coronavirus 2 by RT PCR NEGATIVE NEGATIVE Final    Comment: (NOTE) SARS-CoV-2 target nucleic acids are NOT DETECTED.  The SARS-CoV-2 RNA is generally detectable in upper and lower respiratory specimens during the acute phase of infection. The lowest concentration of SARS-CoV-2 viral copies this assay can detect is 250 copies / mL. A negative result does not preclude SARS-CoV-2 infection and should not be used as the sole basis for treatment or other patient management decisions.  A negative result may occur with improper specimen collection / handling, submission of specimen other than  nasopharyngeal swab, presence of viral mutation(s) within the areas targeted by this assay, and inadequate number of viral copies (<250 copies / mL). A negative result must be combined with clinical observations, patient history, and epidemiological information.  Fact Sheet for Patients:   RoadLapTop.co.za  Fact Sheet for Healthcare Providers: http://kim-miller.com/  This test is not yet approved or  cleared by the [place] FDA and has been authorized for detection and/or diagnosis of SARS-CoV-2 by FDA under an Emergency  Use Authorization (EUA).  This EUA will remain in effect (meaning this test can be used) for the duration of the COVID-19 declaration under Section 564(b)(1) of the Act, 21 U.S.C. section 360bbb-3(b)(1), unless the authorization is terminated or revoked sooner.  Performed at [facility], [address] [place], [place] [address]   Culture, blood (Routine X 2) w Reflex to ID Panel     Status: None   Collection Time: 10/18/22  8:14 AM   Specimen: BLOOD  Result Value Ref Range Status   Specimen Description BLOOD RIGHT ANTECUBITAL  Final   Special Requests   Final    BOTTLES DRAWN AEROBIC AND ANAEROBIC Blood Culture adequate volume   Culture   Final    NO GROWTH 5 DAYS Performed at [facility], [address] [place], [place] [address]    Report Status 10/23/2022 FINAL  Final  Culture, blood (Routine X 2) w Reflex to ID Panel     Status: None   Collection Time: 10/18/22  8:21 AM   Specimen: BLOOD  Result Value Ref Range Status   Specimen Description BLOOD LEFT ANTECUBITAL  Final   Special Requests   Final    BOTTLES DRAWN AEROBIC AND ANAEROBIC Blood Culture adequate volume   Culture   Final    NO GROWTH 5 DAYS Performed at [facility], [address] [place], [place] [address]    Report Status 10/23/2022 FINAL  Final    Labs: CBC: Recent Labs  Lab 06/12/23 2028  06/13/23 0435 06/15/23 0518 06/16/23 0426 06/16/23 2203 06/17/23 0306 06/18/23 0342  WBC 10.5   < > 5.3 4.8 8.7 8.5 8.2  NEUTROABS 9.4*  --   --   --   --   --   --   HGB 10.0*   < > 8.8* 8.3* 8.3* 8.0* 8.4*  HCT 34.5*   < > 29.4* 27.3* 27.2* 25.7* 28.0*  MCV 90.3   < > 87.2 87.5 87.2 86.2 88.1  PLT 225   < > 154 171 238 260 322   < > = values in this interval not displayed.   Basic Metabolic Panel: Recent Labs  Lab 06/14/23 0558 06/14/23 2004 06/15/23 0518 06/16/23 0426 06/16/23 2203 06/17/23 0306 06/18/23 0342  NA 133* 135 136 134* 132* 132* 135  K 2.9* 3.4* 2.9* 3.3* 3.9 3.7 3.2*  CL 96* 96* 96* 98 97* 97* 101  CO2 27 32 32 30 25 25 26   GLUCOSE 94 105* 98 117* 104* 95 121*  BUN <5* <5* <5* <5* <5* <5* <5*  CREATININE 0.50 0.45 0.46 0.46 0.49 0.47 0.60  CALCIUM 8.8* 9.2 9.3 9.4 9.9 9.8 9.4  MG 1.9  --   --   --   --   --   --   PHOS <1.0* 2.1*  --   --   --   --   --    Liver Function Tests: Recent Labs  Lab 06/13/23 0435 06/14/23 0558 06/15/23 0518 06/16/23 0426 06/16/23 2203  AST 90* 59* 95* 75* 102*  ALT 64* 42 46* 42 51*  ALKPHOS 93 72 74 70 77  BILITOT 2.5* 1.6* 1.3* 0.6 0.7  PROT 7.7 6.3* 6.5 6.6 7.3  ALBUMIN 4.6 3.8 3.6 3.6 4.1   CBG: Recent Labs  Lab 06/13/23 2204  GLUCAP 102*    Discharge time spent: greater than 30 minutes.  Signed: Marcelino Duster, MD [facility] 06/18/2023

## 2023-07-19 DIAGNOSIS — F902 Attention-deficit hyperactivity disorder, combined type: Secondary | ICD-10-CM | POA: Diagnosis not present

## 2023-07-19 DIAGNOSIS — F319 Bipolar disorder, unspecified: Secondary | ICD-10-CM | POA: Diagnosis not present

## 2023-07-23 DIAGNOSIS — D250 Submucous leiomyoma of uterus: Secondary | ICD-10-CM | POA: Diagnosis not present

## 2023-07-23 DIAGNOSIS — I10 Essential (primary) hypertension: Secondary | ICD-10-CM | POA: Diagnosis not present

## 2023-07-23 DIAGNOSIS — N83201 Unspecified ovarian cyst, right side: Secondary | ICD-10-CM | POA: Diagnosis not present

## 2023-07-23 DIAGNOSIS — N83202 Unspecified ovarian cyst, left side: Secondary | ICD-10-CM | POA: Diagnosis not present

## 2023-07-23 DIAGNOSIS — R10814 Left lower quadrant abdominal tenderness: Secondary | ICD-10-CM | POA: Diagnosis not present

## 2023-07-23 DIAGNOSIS — E876 Hypokalemia: Secondary | ICD-10-CM | POA: Diagnosis not present

## 2023-07-23 DIAGNOSIS — D259 Leiomyoma of uterus, unspecified: Secondary | ICD-10-CM | POA: Diagnosis not present

## 2023-07-23 DIAGNOSIS — R10813 Right lower quadrant abdominal tenderness: Secondary | ICD-10-CM | POA: Diagnosis not present

## 2023-07-23 DIAGNOSIS — N939 Abnormal uterine and vaginal bleeding, unspecified: Secondary | ICD-10-CM | POA: Diagnosis not present

## 2023-07-23 DIAGNOSIS — N760 Acute vaginitis: Secondary | ICD-10-CM | POA: Diagnosis not present

## 2023-07-23 DIAGNOSIS — F17210 Nicotine dependence, cigarettes, uncomplicated: Secondary | ICD-10-CM | POA: Diagnosis not present

## 2023-07-23 DIAGNOSIS — E875 Hyperkalemia: Secondary | ICD-10-CM | POA: Diagnosis not present

## 2023-07-23 DIAGNOSIS — N920 Excessive and frequent menstruation with regular cycle: Secondary | ICD-10-CM | POA: Diagnosis not present

## 2023-08-01 DIAGNOSIS — S0993XA Unspecified injury of face, initial encounter: Secondary | ICD-10-CM | POA: Diagnosis not present

## 2023-08-01 DIAGNOSIS — Z79899 Other long term (current) drug therapy: Secondary | ICD-10-CM | POA: Diagnosis not present

## 2023-08-01 DIAGNOSIS — F1290 Cannabis use, unspecified, uncomplicated: Secondary | ICD-10-CM | POA: Diagnosis not present

## 2023-08-01 DIAGNOSIS — F909 Attention-deficit hyperactivity disorder, unspecified type: Secondary | ICD-10-CM | POA: Diagnosis not present

## 2023-08-01 DIAGNOSIS — R609 Edema, unspecified: Secondary | ICD-10-CM | POA: Diagnosis not present

## 2023-08-01 DIAGNOSIS — T7411XA Adult physical abuse, confirmed, initial encounter: Secondary | ICD-10-CM | POA: Diagnosis not present

## 2023-08-01 DIAGNOSIS — S1093XA Contusion of unspecified part of neck, initial encounter: Secondary | ICD-10-CM | POA: Diagnosis not present

## 2023-08-01 DIAGNOSIS — F17210 Nicotine dependence, cigarettes, uncomplicated: Secondary | ICD-10-CM | POA: Diagnosis not present

## 2023-08-01 DIAGNOSIS — S42022A Displaced fracture of shaft of left clavicle, initial encounter for closed fracture: Secondary | ICD-10-CM | POA: Diagnosis not present

## 2023-08-01 DIAGNOSIS — T7491XA Unspecified adult maltreatment, confirmed, initial encounter: Secondary | ICD-10-CM | POA: Diagnosis not present

## 2023-08-01 DIAGNOSIS — I10 Essential (primary) hypertension: Secondary | ICD-10-CM | POA: Diagnosis not present

## 2023-08-01 DIAGNOSIS — S0083XA Contusion of other part of head, initial encounter: Secondary | ICD-10-CM | POA: Diagnosis not present

## 2023-08-02 DIAGNOSIS — R609 Edema, unspecified: Secondary | ICD-10-CM | POA: Diagnosis not present

## 2023-08-02 DIAGNOSIS — T7491XA Unspecified adult maltreatment, confirmed, initial encounter: Secondary | ICD-10-CM | POA: Diagnosis not present

## 2023-08-02 DIAGNOSIS — S0083XA Contusion of other part of head, initial encounter: Secondary | ICD-10-CM | POA: Diagnosis not present

## 2023-08-02 DIAGNOSIS — S42022A Displaced fracture of shaft of left clavicle, initial encounter for closed fracture: Secondary | ICD-10-CM | POA: Diagnosis not present

## 2023-08-02 DIAGNOSIS — S0993XA Unspecified injury of face, initial encounter: Secondary | ICD-10-CM | POA: Diagnosis not present

## 2023-10-07 ENCOUNTER — Other Ambulatory Visit: Payer: Self-pay

## 2023-10-07 ENCOUNTER — Emergency Department
Admission: EM | Admit: 2023-10-07 | Discharge: 2023-10-07 | Disposition: A | Discharge status: Home / Self Care | Payer: MEDICAID | Attending: Emergency Medicine | Admitting: Emergency Medicine

## 2023-10-07 ENCOUNTER — Encounter: Payer: Self-pay | Admitting: Emergency Medicine

## 2023-10-07 DIAGNOSIS — S199XXA Unspecified injury of neck, initial encounter: Secondary | ICD-10-CM | POA: Diagnosis present

## 2023-10-07 DIAGNOSIS — S161XXA Strain of muscle, fascia and tendon at neck level, initial encounter: Secondary | ICD-10-CM | POA: Insufficient documentation

## 2023-10-07 DIAGNOSIS — Z23 Encounter for immunization: Secondary | ICD-10-CM | POA: Diagnosis not present

## 2023-10-07 DIAGNOSIS — M25562 Pain in left knee: Secondary | ICD-10-CM | POA: Diagnosis not present

## 2023-10-07 MED ORDER — TETANUS-DIPHTH-ACELL PERTUSSIS 5-2.5-18.5 LF-MCG/0.5 IM SUSY
0.5000 mL | PREFILLED_SYRINGE | Freq: Once | INTRAMUSCULAR | Status: AC
  Administered 2023-10-07: 0.5 mL via INTRAMUSCULAR
  Filled 2023-10-07: qty 0.5
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
## 2023-10-07 NOTE — Discharge Instructions (Signed)
Take acetaminophen 650 mg and ibuprofen 400 mg every 6 hours for pain.  Take with food.  Thank you for choosing us for your health care today!  Please see your primary doctor this week for a follow up appointment.   If you have any new, worsening, or unexpected symptoms call your doctor right away or come back to the emergency department for reevaluation.  It was my pleasure to care for you today.   Yarrow Linhart S. Kealan Buchan, MD  

## 2023-11-26 ENCOUNTER — Other Ambulatory Visit: Payer: Self-pay

## 2023-11-26 ENCOUNTER — Emergency Department
Admission: EM | Admit: 2023-11-26 | Discharge: 2023-11-26 | Disposition: A | Discharge status: Home / Self Care | Payer: MEDICAID | Attending: Emergency Medicine | Admitting: Emergency Medicine

## 2023-11-26 ENCOUNTER — Emergency Department: Payer: MEDICAID

## 2023-11-26 DIAGNOSIS — I10 Essential (primary) hypertension: Secondary | ICD-10-CM | POA: Diagnosis not present

## 2023-11-26 DIAGNOSIS — R109 Unspecified abdominal pain: Secondary | ICD-10-CM | POA: Diagnosis present

## 2023-11-26 DIAGNOSIS — K852 Alcohol induced acute pancreatitis without necrosis or infection: Secondary | ICD-10-CM | POA: Diagnosis not present

## 2023-11-26 DIAGNOSIS — F1010 Alcohol abuse, uncomplicated: Secondary | ICD-10-CM | POA: Insufficient documentation

## 2023-11-26 DIAGNOSIS — E876 Hypokalemia: Secondary | ICD-10-CM | POA: Insufficient documentation

## 2023-11-26 LAB — URINALYSIS, ROUTINE W REFLEX MICROSCOPIC
Bacteria, UA: NONE SEEN
Glucose, UA: NEGATIVE mg/dL
Hgb urine dipstick: NEGATIVE
Ketones, ur: 20 mg/dL — AB
Nitrite: NEGATIVE
Protein, ur: 100 mg/dL — AB
Specific Gravity, Urine: 1.029 (ref 1.005–1.030)
Squamous Epithelial / HPF: >50 /[HPF] (ref 0–5)
pH: 5 (ref 5.0–8.0)

## 2023-11-26 LAB — CBC
HCT: 30.8 % — ABNORMAL LOW (ref 36.0–46.0)
Hemoglobin: 9.6 g/dL — ABNORMAL LOW (ref 12.0–15.0)
MCH: 23.8 pg — ABNORMAL LOW (ref 26.0–34.0)
MCHC: 31.2 g/dL (ref 30.0–36.0)
MCV: 76.4 fL — ABNORMAL LOW (ref 80.0–100.0)
Platelets: 278 10*3/uL (ref 150–400)
RBC: 4.03 MIL/uL (ref 3.87–5.11)
RDW: 22.3 % — ABNORMAL HIGH (ref 11.5–15.5)
WBC: 4.8 10*3/uL (ref 4.0–10.5)
nRBC: 0 % (ref 0.0–0.2)

## 2023-11-26 LAB — COMPREHENSIVE METABOLIC PANEL
ALT: 91 U/L — ABNORMAL HIGH (ref 0–44)
AST: 194 U/L — ABNORMAL HIGH (ref 15–41)
Albumin: 4.5 g/dL (ref 3.5–5.0)
Alkaline Phosphatase: 87 U/L (ref 38–126)
Anion gap: 14 (ref 5–15)
BUN: 10 mg/dL (ref 6–20)
CO2: 30 mmol/L (ref 22–32)
Calcium: 9.4 mg/dL (ref 8.9–10.3)
Chloride: 93 mmol/L — ABNORMAL LOW (ref 98–111)
Creatinine, Ser: 0.69 mg/dL (ref 0.44–1.00)
GFR, Estimated: >60 mL/min (ref >60)
Glucose, Bld: 117 mg/dL — ABNORMAL HIGH (ref 70–99)
Potassium: 2.9 mmol/L — ABNORMAL LOW (ref 3.5–5.1)
Sodium: 137 mmol/L (ref 135–145)
Total Bilirubin: 1.9 mg/dL — ABNORMAL HIGH (ref <1.2)
Total Protein: 8.1 g/dL (ref 6.5–8.1)

## 2023-11-26 LAB — POC URINE PREG, ED: Preg Test, Ur: NEGATIVE

## 2023-11-26 LAB — LIPASE, BLOOD: Lipase: 61 U/L — ABNORMAL HIGH (ref 11–51)

## 2023-11-26 MED ORDER — ONDANSETRON 4 MG PO TBDP: 4.0000 mg | ORAL_TABLET | Freq: Three times a day (TID) | ORAL | 0 refills | Status: DC | PRN

## 2023-11-26 MED ORDER — ONDANSETRON HCL 4 MG/2ML IJ SOLN
4.0000 mg | Freq: Once | INTRAMUSCULAR | Status: AC
  Administered 2023-11-26: 4 mg via INTRAVENOUS
  Filled 2023-11-26: qty 2

## 2023-11-26 MED ORDER — SODIUM CHLORIDE 0.9 % IV BOLUS
1000.0000 mL | Freq: Once | INTRAVENOUS | Status: AC
  Administered 2023-11-26: 1000 mL via INTRAVENOUS

## 2023-11-26 MED ORDER — CHLORDIAZEPOXIDE HCL 25 MG PO CAPS: ORAL_CAPSULE | ORAL | 0 refills | Status: AC

## 2023-11-26 MED ORDER — MORPHINE SULFATE (PF) 4 MG/ML IV SOLN
4.0000 mg | Freq: Once | INTRAVENOUS | Status: AC
  Administered 2023-11-26: 4 mg via INTRAVENOUS
  Filled 2023-11-26: qty 1

## 2023-11-26 MED ORDER — POTASSIUM CHLORIDE CRYS ER 20 MEQ PO TBCR
40.0000 meq | EXTENDED_RELEASE_TABLET | Freq: Once | ORAL | Status: AC
  Administered 2023-11-26: 40 meq via ORAL
  Filled 2023-11-26: qty 2

## 2023-11-26 MED ORDER — FLUCONAZOLE 150 MG PO TABS: ORAL_TABLET | ORAL | 0 refills | Status: DC

## 2023-11-26 NOTE — ED Triage Notes (Signed)
Pt c/o abd pain with nausea, vomiting, and lower back pain x3 days with hx of hypertension.

## 2023-11-26 NOTE — ED Provider Notes (Cosign Needed Addendum)
[facility] Provider Note    Event Date/Time   First MD Initiated Contact with Patient 11/26/23 [phone]     (approximate)   History   Abdominal Pain   HPI  Gina Rich is a 37 y.o. female with history of substance abuse, alcohol abuse, ADHD and hypertension presents emergency department with concerns of vomiting, abdominal and back pain.  Patient states she drinks 1/5 of liquor per day.  Sometimes worse when she is not working.  Has had pancreatitis before and had to be admitted.  States she hurts but not sure its as bad as that has been.      Physical Exam   Triage Vital Signs: ED Triage Vitals  Encounter Vitals Group     BP 11/26/23 1344 (!) 134/99     Systolic BP Percentile --      Diastolic BP Percentile --      Pulse Rate 11/26/23 1344 95     Resp 11/26/23 1344 16     Temp 11/26/23 1344 98.5 F (36.9 C)     Temp Source 11/26/23 1344 Oral     SpO2 11/26/23 1344 98 %     Weight 11/26/23 1640 156 lb 12 oz (71.1 kg)     Height 11/26/23 1345 5\' 1"  (1.549 m)     Head Circumference --      Peak Flow --      Pain Score 11/26/23 1345 9     Pain Loc --      Pain Education --      Exclude from Growth Chart --     Most recent vital signs: Vitals:   11/26/23 1344 11/26/23 1805  BP: (!) 134/99 130/88  Pulse: 95 88  Resp: 16 16  Temp: 98.5 F (36.9 C) 98 F (36.7 C)  SpO2: 98% 98%     General: Awake, no distress.   CV:  Good peripheral perfusion. regular rate and  rhythm Resp:  Normal effort. Lungs cta Abd:  No distention. Tender in ruq only  Other:      ED Results / Procedures / Treatments   Labs (all labs ordered are listed, but only abnormal results are displayed) Labs Reviewed  LIPASE, BLOOD - Abnormal; Notable for the following components:      Result Value   Lipase 61 (*)    All other components within normal limits  COMPREHENSIVE METABOLIC PANEL - Abnormal; Notable for the following components:   Potassium 2.9 (*)     Chloride 93 (*)    Glucose, Bld 117 (*)    AST 194 (*)    ALT 91 (*)    Total Bilirubin 1.9 (*)    All other components within normal limits  CBC - Abnormal; Notable for the following components:   Hemoglobin 9.6 (*)    HCT 30.8 (*)    MCV 76.4 (*)    MCH 23.8 (*)    RDW 22.3 (*)    All other components within normal limits  URINALYSIS, ROUTINE W REFLEX MICROSCOPIC - Abnormal; Notable for the following components:   Color, Urine AMBER (*)    APPearance CLOUDY (*)    Bilirubin Urine MODERATE (*)    Ketones, ur 20 (*)    Protein, ur 100 (*)    Leukocytes,Ua TRACE (*)    All other components within normal limits  POC URINE PREG, ED     EKG     RADIOLOGY [place] ruq    PROCEDURES:   Procedures  MEDICATIONS ORDERED IN ED: Medications  potassium chloride SA (KLOR-CON M) CR tablet 40 mEq (40 mEq Oral Given 11/26/23 1745)  sodium chloride 0.9 % bolus 1,000 mL (1,000 mLs Intravenous New Bag/Given 11/26/23 1746)  morphine (PF) 4 MG/ML injection 4 mg (4 mg Intravenous Given 11/26/23 1746)  ondansetron (ZOFRAN) injection 4 mg (4 mg Intravenous Given 11/26/23 1746)     IMPRESSION / MDM / ASSESSMENT AND PLAN / ED COURSE  I reviewed the triage vital signs and the nursing notes.                              Differential diagnosis includes, but is not limited to, pancreatitis, acute cholecystitis, alcohol withdrawal, hepatic failure, dehydration  Patient's presentation is most consistent with acute illness / injury with system symptoms.   Labs show hypokalemia with a potassium of 2.9, gave the patient 40 mEq of potassium while here in the ED  Hepatic enzymes are elevated but seems to be of the patient's trend due to her alcohol abuse  UA shows ketones moderate bili's trace of leuks consistent with her alcohol use a dehydration  Lipase is slightly elevated indicating mild pancreatitis  Ultrasound of the abdomen right upper quadrant was independently reviewed interpreted  by me as being negative for any acute abnormality  The patient is feeling much better she is asking if she can eat.  No more nausea or vomiting.  Would like to go home.  She denies any bloody vomit or bloody stools.  Feel that she has a GI bleed secondary to her alcohol abuse.  Feel it mainly today is just slight pancreatitis from her EtOH abuse.  I did offer the patient a Librium taper if she would like to stop drinking.  She is in agreement with this.  She would like to try it.  Will also send a prescription for Zofran.  Plaisance complains of some vaginal itching and thinks she has it yeast infection so we will send Diflucan for that.  She is to follow-up with her regular doctor and her therapist concerning her alcohol use.  She is to return emergency department if any withdrawal symptoms that are not resolved with the Librium.  I did caution her that withdrawal from alcohol can cause seizures, arrhythmias etc.  She states she understands.  Discharged in stable condition.      FINAL CLINICAL IMPRESSION(S) / ED DIAGNOSES   Final diagnoses:  Alcohol-induced acute pancreatitis without infection or necrosis  ETOH abuse     Rx / DC Orders   ED Discharge Orders          Ordered    ondansetron (ZOFRAN-ODT) 4 MG disintegrating tablet  Every 8 hours PRN        11/26/23 1911    fluconazole (DIFLUCAN) 150 MG tablet        11/26/23 1911    chlordiazePOXIDE (LIBRIUM) 25 MG capsule  Multiple Frequencies        11/26/23 1911             Note:  This document was prepared using Dragon voice recognition software and may include unintentional dictation errors.    Faythe Ghee, PA-C 11/26/23 1921

## 2023-11-26 NOTE — ED Notes (Signed)
See triage note  Presents with a 2 week hx of intermittent n/v  States she also has had intermittent fever  Currently afebrile

## 2023-11-26 NOTE — Discharge Instructions (Signed)
For the Librium taper to 50 to 100 mg by mouth every 4-6 hours for withdrawal symptoms on day 1 On day  2 use 50-100mg  every 6-8 hours for symptoms of withdrawal On day 3 use 50-100 mg every 12 hours for symptoms of withdrawal On day 4 use 50-100 mg at night

## 2023-12-18 ENCOUNTER — Other Ambulatory Visit: Payer: Self-pay

## 2023-12-18 DIAGNOSIS — R197 Diarrhea, unspecified: Secondary | ICD-10-CM | POA: Diagnosis not present

## 2023-12-18 DIAGNOSIS — E860 Dehydration: Secondary | ICD-10-CM | POA: Insufficient documentation

## 2023-12-18 DIAGNOSIS — I10 Essential (primary) hypertension: Secondary | ICD-10-CM | POA: Diagnosis not present

## 2023-12-18 DIAGNOSIS — R7309 Other abnormal glucose: Secondary | ICD-10-CM | POA: Diagnosis not present

## 2023-12-18 DIAGNOSIS — R7401 Elevation of levels of liver transaminase levels: Secondary | ICD-10-CM | POA: Diagnosis not present

## 2023-12-18 DIAGNOSIS — R109 Unspecified abdominal pain: Secondary | ICD-10-CM | POA: Insufficient documentation

## 2023-12-18 DIAGNOSIS — R112 Nausea with vomiting, unspecified: Secondary | ICD-10-CM | POA: Insufficient documentation

## 2023-12-18 DIAGNOSIS — Z79899 Other long term (current) drug therapy: Secondary | ICD-10-CM | POA: Diagnosis not present

## 2023-12-18 DIAGNOSIS — D649 Anemia, unspecified: Secondary | ICD-10-CM | POA: Insufficient documentation

## 2023-12-18 LAB — CBC
HCT: 30 % — ABNORMAL LOW (ref 36.0–46.0)
Hemoglobin: 8.9 g/dL — ABNORMAL LOW (ref 12.0–15.0)
MCH: 23.8 pg — ABNORMAL LOW (ref 26.0–34.0)
MCHC: 29.7 g/dL — ABNORMAL LOW (ref 30.0–36.0)
MCV: 80.2 fL (ref 80.0–100.0)
Platelets: 168 10*3/uL (ref 150–400)
RBC: 3.74 MIL/uL — ABNORMAL LOW (ref 3.87–5.11)
RDW: 22.2 % — ABNORMAL HIGH (ref 11.5–15.5)
WBC: 9.5 10*3/uL (ref 4.0–10.5)
nRBC: 0 % (ref 0.0–0.2)

## 2023-12-18 NOTE — ED Triage Notes (Signed)
 Pt to ED via EMS from home, pt reports n/v/d that began this morning. Pt states she took 4mg  zofran PTA with no relief. Pt also reports lower abd cramping.

## 2023-12-19 ENCOUNTER — Emergency Department
Admission: EM | Admit: 2023-12-19 | Discharge: 2023-12-19 | Disposition: A | Discharge status: Home / Self Care | Payer: MEDICAID | Attending: Emergency Medicine | Admitting: Emergency Medicine

## 2023-12-19 DIAGNOSIS — R197 Diarrhea, unspecified: Secondary | ICD-10-CM

## 2023-12-19 DIAGNOSIS — R112 Nausea with vomiting, unspecified: Secondary | ICD-10-CM

## 2023-12-19 DIAGNOSIS — E860 Dehydration: Secondary | ICD-10-CM

## 2023-12-19 LAB — COMPREHENSIVE METABOLIC PANEL
ALT: 40 U/L (ref 0–44)
AST: 86 U/L — ABNORMAL HIGH (ref 15–41)
Albumin: 4.5 g/dL (ref 3.5–5.0)
Alkaline Phosphatase: 98 U/L (ref 38–126)
Anion gap: 22 — ABNORMAL HIGH (ref 5–15)
BUN: 7 mg/dL (ref 6–20)
CO2: 17 mmol/L — ABNORMAL LOW (ref 22–32)
Calcium: 8.6 mg/dL — ABNORMAL LOW (ref 8.9–10.3)
Chloride: 97 mmol/L — ABNORMAL LOW (ref 98–111)
Creatinine, Ser: 0.68 mg/dL (ref 0.44–1.00)
GFR, Estimated: >60 mL/min (ref >60)
Glucose, Bld: 150 mg/dL — ABNORMAL HIGH (ref 70–99)
Potassium: 4.3 mmol/L (ref 3.5–5.1)
Sodium: 136 mmol/L (ref 135–145)
Total Bilirubin: 2.2 mg/dL — ABNORMAL HIGH (ref 0.0–1.2)
Total Protein: 7.9 g/dL (ref 6.5–8.1)

## 2023-12-19 LAB — URINALYSIS, ROUTINE W REFLEX MICROSCOPIC
Bacteria, UA: NONE SEEN
RBC / HPF: >50 RBC/hpf (ref 0–5)
WBC, UA: >50 WBC/hpf (ref 0–5)

## 2023-12-19 LAB — POC URINE PREG, ED: Preg Test, Ur: NEGATIVE

## 2023-12-19 LAB — LIPASE, BLOOD: Lipase: 25 U/L (ref 11–51)

## 2023-12-19 MED ORDER — PANTOPRAZOLE SODIUM 40 MG IV SOLR
40.0000 mg | Freq: Once | INTRAVENOUS | Status: AC
  Administered 2023-12-19: 40 mg via INTRAVENOUS
  Filled 2023-12-19: qty 10

## 2023-12-19 MED ORDER — KETOROLAC TROMETHAMINE 30 MG/ML IJ SOLN
30.0000 mg | Freq: Once | INTRAMUSCULAR | Status: AC
  Administered 2023-12-19: 30 mg via INTRAVENOUS
  Filled 2023-12-19: qty 1

## 2023-12-19 MED ORDER — MORPHINE SULFATE (PF) 4 MG/ML IV SOLN
4.0000 mg | Freq: Once | INTRAVENOUS | Status: AC
  Administered 2023-12-19: 4 mg via INTRAVENOUS
  Filled 2023-12-19: qty 1

## 2023-12-19 MED ORDER — ONDANSETRON HCL 4 MG/2ML IJ SOLN
4.0000 mg | Freq: Once | INTRAMUSCULAR | Status: AC
  Administered 2023-12-19: 4 mg via INTRAVENOUS
  Filled 2023-12-19: qty 2

## 2023-12-19 MED ORDER — ACETAMINOPHEN 500 MG PO TABS
1000.0000 mg | ORAL_TABLET | Freq: Once | ORAL | Status: AC
  Administered 2023-12-19: 1000 mg via ORAL
  Filled 2023-12-19: qty 2

## 2023-12-19 MED ORDER — SODIUM CHLORIDE 0.9 % IV BOLUS (SEPSIS)
2000.0000 mL | Freq: Once | INTRAVENOUS | Status: AC
  Administered 2023-12-19: 2000 mL via INTRAVENOUS

## 2023-12-19 MED ORDER — PANTOPRAZOLE SODIUM 40 MG PO TBEC
40.0000 mg | DELAYED_RELEASE_TABLET | Freq: Every day | ORAL | 0 refills | Status: AC
Start: 2023-12-19 — End: 2024-01-18

## 2023-12-19 MED ORDER — ONDANSETRON 4 MG PO TBDP
4.0000 mg | ORAL_TABLET | Freq: Four times a day (QID) | ORAL | 0 refills | Status: AC | PRN
Start: 2023-12-19 — End: ?

## 2023-12-19 MED ORDER — DICYCLOMINE HCL 20 MG PO TABS
20.0000 mg | ORAL_TABLET | Freq: Three times a day (TID) | ORAL | 0 refills | Status: AC | PRN
Start: 2023-12-19 — End: ?

## 2023-12-19 MED ORDER — PROCHLORPERAZINE EDISYLATE 10 MG/2ML IJ SOLN
10.0000 mg | Freq: Once | INTRAMUSCULAR | Status: AC
  Administered 2023-12-19: 10 mg via INTRAVENOUS
  Filled 2023-12-19: qty 2

## 2023-12-19 NOTE — Discharge Instructions (Signed)
 You may alternate Tylenol 1000 mg every 6 hours as needed for pain, fever and Ibuprofen 800 mg every 6-8 hours as needed for pain, fever.  Please take Ibuprofen with food.  Do not take more than 4000 mg of Tylenol (acetaminophen) in a 24 hour period.  You may take over-the-counter Imodium as needed for diarrhea.  I recommend a bland diet for the next several days.

## 2023-12-19 NOTE — ED Notes (Signed)
 Urine, po challenge   Hortencia Martire, Layla Maw, DO 12/19/23 [phone]

## 2023-12-19 NOTE — ED Notes (Signed)
 Patient given [place] sandwich and cranberry juice per request.

## 2023-12-19 NOTE — ED Provider Notes (Signed)
 [facility] Provider Note    Event Date/Time   First MD Initiated Contact with Patient 12/19/23 0210     (approximate)   History   Nausea   HPI  Gina Rich is a 38 y.o. female with history of hypertension, ADHD, alcohol  abuse presents to the emergency department several days of nausea, vomiting, diarrhea, abnormal smelling urine, abdominal burning.  Denies previous abdominal surgery.  No dysuria, vaginal discharge.  She is on her menstrual cycle.   History provided by patient.    Past Medical History:  Diagnosis Date   ADHD (attention deficit hyperactivity disorder)    Hypertension     History reviewed. No pertinent surgical history.  MEDICATIONS:  Prior to Admission medications   Medication Sig Start Date End Date Taking? Authorizing Provider  amLODipine  (NORVASC ) 5 MG tablet Take 5 mg by mouth daily. 10/31/22 10/31/23  [provider]  fluconazole  (DIFLUCAN ) 150 MG tablet Take one now and one in a week 11/26/23   Gasper, Devere ORN, PA-C  lisdexamfetamine (VYVANSE ) 20 MG capsule Take 20 mg by mouth 2 (two) times daily.    [provider]  omeprazole (PRILOSEC) 40 MG capsule Take 40 mg by mouth daily. 02/15/23 02/15/24  [provider]  ondansetron  (ZOFRAN -ODT) 4 MG disintegrating tablet Take 1 tablet (4 mg total) by mouth every 8 (eight) hours as needed. 11/26/23   Fisher, Devere ORN, PA-C  polyvinyl alcohol  (LIQUIFILM TEARS) 1.4 % ophthalmic solution Place 2 drops into both eyes as needed for dry eyes. 06/16/23   Christobal Guadalajara, MD  potassium chloride  SA (KLOR-CON  M) 20 MEQ tablet Take 1 tablet (20 mEq total) by mouth 2 (two) times daily. 06/18/23   Darci Pore, MD    Physical Exam   Triage Vital Signs: ED Triage Vitals  Encounter Vitals Group     BP 12/18/23 2322 123/82     Systolic BP Percentile --      Diastolic BP Percentile --      Pulse Rate 12/18/23 2322 88     Resp 12/18/23 2322 18     Temp 12/18/23 2322  98.3 F (36.8 C)     Temp Source 12/18/23 2322 Oral     SpO2 12/18/23 2322 98 %     Weight 12/18/23 2321 150 lb (68 kg)     Height 12/18/23 2321 5' 1 (1.549 m)     Head Circumference --      Peak Flow --      Pain Score 12/18/23 2321 10     Pain Loc --      Pain Education --      Exclude from Growth Chart --     Most recent vital signs: Vitals:   12/19/23 0430 12/19/23 0501  BP: 118/85   Pulse: 71   Resp: 18   Temp:  98.2 F (36.8 C)  SpO2: 100%     CONSTITUTIONAL: Alert, responds appropriately to questions. Well-appearing; well-nourished HEAD: Normocephalic, atraumatic EYES: Conjunctivae clear, pupils appear equal, sclera nonicteric ENT: normal nose; moist mucous membranes NECK: Supple, normal ROM CARD: RRR; S1 and S2 appreciated RESP: Normal chest excursion without splinting or tachypnea; breath sounds clear and equal bilaterally; no wheezes, no rhonchi, no rales, no hypoxia or respiratory distress, speaking full sentences ABD/GI: Non-distended; soft, non-tender, no rebound, no guarding, no peritoneal signs BACK: The back appears normal EXT: Normal ROM in all joints; no deformity noted, no edema SKIN: Normal color for age and race; warm; no rash  on exposed skin NEURO: Moves all extremities equally, normal speech PSYCH: The patient's mood and manner are appropriate.   ED Results / Procedures / Treatments   LABS: (all labs ordered are listed, but only abnormal results are displayed) Labs Reviewed  COMPREHENSIVE METABOLIC PANEL - Abnormal; Notable for the following components:      Result Value   Chloride 97 (*)    CO2 17 (*)    Glucose, Bld 150 (*)    Calcium  8.6 (*)    AST 86 (*)    Total Bilirubin 2.2 (*)    Anion gap 22 (*)    All other components within normal limits  CBC - Abnormal; Notable for the following components:   RBC 3.74 (*)    Hemoglobin 8.9 (*)    HCT 30.0 (*)    MCH 23.8 (*)    MCHC 29.7 (*)    RDW 22.2 (*)    All other components  within normal limits  URINALYSIS, ROUTINE W REFLEX MICROSCOPIC - Abnormal; Notable for the following components:   Color, Urine RED (*)    APPearance CLOUDY (*)    Glucose, UA   (*)    Value: TEST NOT REPORTED DUE TO COLOR INTERFERENCE OF URINE PIGMENT   Hgb urine dipstick   (*)    Value: TEST NOT REPORTED DUE TO COLOR INTERFERENCE OF URINE PIGMENT   Bilirubin Urine   (*)    Value: TEST NOT REPORTED DUE TO COLOR INTERFERENCE OF URINE PIGMENT   Ketones, ur   (*)    Value: TEST NOT REPORTED DUE TO COLOR INTERFERENCE OF URINE PIGMENT   Protein, ur   (*)    Value: TEST NOT REPORTED DUE TO COLOR INTERFERENCE OF URINE PIGMENT   Nitrite   (*)    Value: TEST NOT REPORTED DUE TO COLOR INTERFERENCE OF URINE PIGMENT   Leukocytes,Ua   (*)    Value: TEST NOT REPORTED DUE TO COLOR INTERFERENCE OF URINE PIGMENT   All other components within normal limits  POC URINE PREG, ED - Normal  LIPASE, BLOOD     EKG:   RADIOLOGY: My personal review and interpretation of imaging:    I have personally reviewed all radiology reports.   No results found.   PROCEDURES:  Critical Care performed: No     Procedures    IMPRESSION / MDM / ASSESSMENT AND PLAN / ED COURSE  I reviewed the triage vital signs and the nursing notes.    Patient here for nausea, vomiting, diarrhea, generalized abdominal pain.   DIFFERENTIAL DIAGNOSIS (includes but not limited to):   Viral gastroenteritis, dehydration, UTI, doubt appendicitis, colitis, diverticulitis, perforation   Patient's presentation is most consistent with acute presentation with potential threat to life or bodily function.   PLAN: Labs obtained from triage show metabolic acidosis with elevated anion gap.  Glucose is mildly elevated at 150 but she denies any history of diabetes.  I suspect this is more likely secondary to dehydration, starvation ketosis.  Patient has chronic anemia which is unchanged.  AST, total bilirubin minimally elevated which  she has had previously.  History of alcohol  abuse documented in her chart.  Lipase normal.  No upper abdominal, right upper quadrant tenderness on exam.  Will give IV fluids, symptomatic management here.  Will p.o. challenge.   MEDICATIONS GIVEN IN ED: Medications  sodium chloride  0.9 % bolus 2,000 mL (0 mLs Intravenous Stopped 12/19/23 0457)  ondansetron  (ZOFRAN ) injection 4 mg (4 mg Intravenous Given 12/19/23  0222)  ketorolac  (TORADOL ) 30 MG/ML injection 30 mg (30 mg Intravenous Given 12/19/23 0221)  prochlorperazine  (COMPAZINE ) injection 10 mg (10 mg Intravenous Given 12/19/23 0318)  acetaminophen  (TYLENOL ) tablet 1,000 mg (1,000 mg Oral Given 12/19/23 0317)  morphine  (PF) 4 MG/ML injection 4 mg (4 mg Intravenous Given 12/19/23 0344)  pantoprazole  (PROTONIX ) injection 40 mg (40 mg Intravenous Given 12/19/23 0343)     ED COURSE: Patient feeling better, tolerating p.o.  She is not tachycardic or hypertensive here.  No tremors, delirium, hallucination.  No sign of acute withdrawal.  Urine shows large bit of red blood cells and white blood cells likely all from her menstrual cycle.  No bacteria.  She is not having dysuria, urinary frequency or urgency to suggest UTI.  She denies any concern for STIs.  Again suspect viral gastritis causing dehydration.  She has received 2 L of IV fluids here.  I feel she is safe for discharge home.  Patient comfortable with this plan.   At this time, I do not feel there is any life-threatening condition present. I reviewed all nursing notes, vitals, pertinent previous records.  All lab and urine results, EKGs, imaging ordered have been independently reviewed and interpreted by myself.  I reviewed all available radiology reports from any imaging ordered this visit.  Based on my assessment, I feel the patient is safe to be discharged home without further emergent workup and can continue workup as an outpatient as needed. Discussed all findings, treatment plan as well as usual and  customary return precautions.  They verbalize understanding and are comfortable with this plan.  Outpatient follow-up has been provided as needed.  All questions have been answered.    CONSULTS:  none   OUTSIDE RECORDS REVIEWED: Reviewed previous records at [facility] and [facility] for alcohol  abuse, alcohol  withdrawal.       FINAL CLINICAL IMPRESSION(S) / ED DIAGNOSES   Final diagnoses:  Nausea vomiting and diarrhea  Dehydration     Rx / DC Orders   ED Discharge Orders          Ordered    ondansetron  (ZOFRAN -ODT) 4 MG disintegrating tablet  Every 6 hours PRN        12/19/23 0352    dicyclomine  (BENTYL ) 20 MG tablet  Every 8 hours PRN        12/19/23 0352    pantoprazole  (PROTONIX ) 40 MG tablet  Daily        12/19/23 0352             Note:  This document was prepared using Dragon voice recognition software and may include unintentional dictation errors.   Curry Dulski, Josette SAILOR, DO 12/19/23 0800

## 2024-03-16 ENCOUNTER — Other Ambulatory Visit: Payer: MEDICAID

## 2024-03-18 ENCOUNTER — Other Ambulatory Visit: Payer: MEDICAID

## 2024-06-08 ENCOUNTER — Other Ambulatory Visit: Payer: Self-pay

## 2024-06-08 ENCOUNTER — Emergency Department
Admission: EM | Admit: 2024-06-08 | Discharge: 2024-06-08 | Disposition: A | Discharge status: Home / Self Care | Payer: MEDICAID | Attending: Emergency Medicine | Admitting: Emergency Medicine

## 2024-06-08 DIAGNOSIS — S01531A Puncture wound without foreign body of lip, initial encounter: Secondary | ICD-10-CM

## 2024-06-08 DIAGNOSIS — S01551A Open bite of lip, initial encounter: Secondary | ICD-10-CM | POA: Insufficient documentation

## 2024-06-08 DIAGNOSIS — X58XXXA Exposure to other specified factors, initial encounter: Secondary | ICD-10-CM | POA: Insufficient documentation

## 2024-06-08 DIAGNOSIS — R569 Unspecified convulsions: Secondary | ICD-10-CM | POA: Diagnosis not present

## 2024-06-08 DIAGNOSIS — F10231 Alcohol dependence with withdrawal delirium: Secondary | ICD-10-CM | POA: Diagnosis not present

## 2024-06-08 LAB — CBC WITH DIFFERENTIAL/PLATELET
Abs Immature Granulocytes: 0.03 10*3/uL (ref 0.00–0.07)
Basophils Absolute: 0 10*3/uL (ref 0.0–0.1)
Basophils Relative: 0 %
Eosinophils Absolute: 0 10*3/uL (ref 0.0–0.5)
Eosinophils Relative: 0 %
HCT: 30.8 % — ABNORMAL LOW (ref 36.0–46.0)
Hemoglobin: 8.9 g/dL — ABNORMAL LOW (ref 12.0–15.0)
Immature Granulocytes: 1 %
Lymphocytes Relative: 19 %
Lymphs Abs: 1.1 10*3/uL (ref 0.7–4.0)
MCH: 22 pg — ABNORMAL LOW (ref 26.0–34.0)
MCHC: 28.9 g/dL — ABNORMAL LOW (ref 30.0–36.0)
MCV: 76 fL — ABNORMAL LOW (ref 80.0–100.0)
Monocytes Absolute: 0.6 10*3/uL (ref 0.1–1.0)
Monocytes Relative: 11 %
Neutro Abs: 4.1 10*3/uL (ref 1.7–7.7)
Neutrophils Relative %: 69 %
Platelets: 225 10*3/uL (ref 150–400)
RBC: 4.05 MIL/uL (ref 3.87–5.11)
RDW: 22.6 % — ABNORMAL HIGH (ref 11.5–15.5)
Smear Review: NORMAL
WBC: 5.8 10*3/uL (ref 4.0–10.5)
nRBC: 0 % (ref 0.0–0.2)

## 2024-06-08 LAB — COMPREHENSIVE METABOLIC PANEL WITH GFR
ALT: 54 U/L — ABNORMAL HIGH (ref 0–44)
AST: 143 U/L — ABNORMAL HIGH (ref 15–41)
Albumin: 4.1 g/dL (ref 3.5–5.0)
Alkaline Phosphatase: 102 U/L (ref 38–126)
Anion gap: 17 — ABNORMAL HIGH (ref 5–15)
BUN: 9 mg/dL (ref 6–20)
CO2: 21 mmol/L — ABNORMAL LOW (ref 22–32)
Calcium: 9 mg/dL (ref 8.9–10.3)
Chloride: 95 mmol/L — ABNORMAL LOW (ref 98–111)
Creatinine, Ser: 0.67 mg/dL (ref 0.44–1.00)
GFR, Estimated: >60 mL/min (ref >60)
Glucose, Bld: 96 mg/dL (ref 70–99)
Potassium: 3.7 mmol/L (ref 3.5–5.1)
Sodium: 133 mmol/L — ABNORMAL LOW (ref 135–145)
Total Bilirubin: 2.5 mg/dL — ABNORMAL HIGH (ref 0.0–1.2)
Total Protein: 8 g/dL (ref 6.5–8.1)

## 2024-06-08 LAB — CK: Total CK: 58 U/L (ref 38–234)

## 2024-06-08 LAB — ETHANOL: Alcohol, Ethyl (B): <15 mg/dL (ref <15)

## 2024-06-08 MED ORDER — CHLORDIAZEPOXIDE HCL 25 MG PO CAPS: ORAL_CAPSULE | ORAL | 0 refills | Status: AC

## 2024-06-08 MED ORDER — ACETAMINOPHEN 500 MG PO TABS
1000.0000 mg | ORAL_TABLET | Freq: Once | ORAL | Status: AC
  Administered 2024-06-08: 1000 mg via ORAL
  Filled 2024-06-08: qty 2

## 2024-06-08 MED ORDER — CHLORDIAZEPOXIDE HCL 25 MG PO CAPS
100.0000 mg | ORAL_CAPSULE | Freq: Once | ORAL | Status: AC
  Administered 2024-06-08: 100 mg via ORAL
  Filled 2024-06-08: qty 4

## 2024-06-08 NOTE — ED Provider Notes (Signed)
 [facility] Provider Note   Event Date/Time   First MD Initiated Contact with Patient 06/08/24 1301     (approximate) History  Seizures  HPI Gina [name] is a 38 y.o. female with a past medical history of alcohol  abuse with withdrawal seizures who presents after [name] episode of seizure-like activity [place] the court [place] today.  Patient arrives via EMS after 3 to 4 minutes of seizure-like activity in which she bit her lip.  Patient states that her last alcohol  use was 2 days prior and she does use alcohol  daily.  Patient states that she has a history of this in the past and knows that when she does not drink she can get seizures. ROS: Patient currently denies any vision changes, tinnitus, difficulty speaking, facial droop, sore throat, chest pain, shortness of breath, abdominal pain, nausea/vomiting/diarrhea, dysuria, or weakness/numbness/paresthesias in any extremity   Physical Exam  Triage Vital Signs: ED Triage Vitals  Encounter Vitals Group     BP 06/08/24 1304 (!) 135/99     Girls Systolic BP Percentile --      Girls Diastolic BP Percentile --      Boys Systolic BP Percentile --      Boys Diastolic BP Percentile --      Pulse Rate 06/08/24 1304 81     Resp 06/08/24 1304 16     Temp 06/08/24 1304 98 F (36.7 C)     Temp Source 06/08/24 1304 Oral     SpO2 06/08/24 1304 100 %     Weight 06/08/24 1308 150 lb (68 kg)     Height 06/08/24 1308 5' 2 (1.575 m)     Head Circumference --      Peak Flow --      Pain Score 06/08/24 1305 8     Pain Loc --      Pain Education --      Exclude from Growth Chart --    Most recent vital signs: Vitals:   06/08/24 1330 06/08/24 1430  BP: 113/88 107/80  Pulse: 75 69  Resp:  16  Temp:    SpO2: 97% 100%   General: Awake, oriented x4. CV:  Good peripheral perfusion. Resp:  Normal effort. Abd:  No distention. Other:  Middle-aged overweight African-American female resting comfortably in no acute distress.  Punctate  wound to the left lower lip that is hemostatic ED Results / Procedures / Treatments  Labs (all labs ordered are listed, but only abnormal results are displayed) Labs Reviewed  CBC WITH DIFFERENTIAL/PLATELET - Abnormal; Notable for the following components:      Result Value   Hemoglobin 8.9 (*)    HCT 30.8 (*)    MCV 76.0 (*)    MCH 22.0 (*)    MCHC 28.9 (*)    RDW 22.6 (*)    All other components within normal limits  COMPREHENSIVE METABOLIC PANEL WITH GFR - Abnormal; Notable for the following components:   Sodium 133 (*)    Chloride 95 (*)    CO2 21 (*)    AST 143 (*)    ALT 54 (*)    Total Bilirubin 2.5 (*)    Anion gap 17 (*)    All other components within normal limits  ETHANOL  CK  CBC WITH DIFFERENTIAL/PLATELET  URINALYSIS, W/ REFLEX TO CULTURE (INFECTION SUSPECTED)  URINE DRUG SCREEN, QUALITATIVE ([facility] ONLY)   PROCEDURES: Critical Care performed: No Procedures MEDICATIONS ORDERED IN ED: Medications  acetaminophen  (TYLENOL ) tablet 1,000  mg (1,000 mg Oral Given 06/08/24 1416)  chlordiazePOXIDE  (LIBRIUM ) capsule 100 mg (100 mg Oral Given 06/08/24 1433)   IMPRESSION / MDM / ASSESSMENT AND PLAN / ED COURSE  I reviewed the triage vital signs and the nursing notes.                             The patient is on the cardiac monitor to evaluate for evidence of arrhythmia and/or significant heart rate changes. Patient's presentation is most consistent with acute presentation with potential threat to life or bodily function. Patient presents after recent seizure episode.  Patient had slow return to baseline mental and physical function per bystanders. No immunosuppresion hx and had no preceding fever. No history of alcohol  abuse or suspicion for toxin ingestion. Unlikely stroke, syncope. Unlikely infectious etiology. No preceding trauma.  Workup: EKG, BMP, POCT glucose (pregnancy test if female) and CT Brain.  Field Interventions: None ED  Interventions: Librium  Disposition:  This patient has elected to leave against medical advice. In my opinion, the patient has capacity to leave AMA. The patient is clinically sober, free from distracting injury, appears to have intact insight and judgment and reason, and in my opinion has capacity to make decisions. I explained to the patient that his symptoms may represent complicated alcohol  withdrawal and the patient verbalized understanding of my concerns.  I had a discussion with the patient about their workup and results, and that they may still have seizures despite medications. I informed the patient that the next step in diagnosis and treatment would be admission for phenobarb taper, and they verbalized understanding of this as well. I explained the risks of leaving without further workup or treatment, which included reasonably foreseeable complications such as death, serious injury, permanent disability, and morbidity. I also offered alternatives to departing AMA such as assigning the patient a different provider or [name] alternate workup pathway.  The patient is refusing any further care, specifically admission, and is leaving against medical advice. I am unable to convince the patient to stay. I have asked them to return as soon as possible to complete their evaluation, and also explained that they were welcome to return to the ER for further evaluation whenever they choose. I have asked the patient to follow up with their primary doctor as soon as possible. I have answered all their questions. Patient signed AMA paperwork.  FINAL CLINICAL IMPRESSION(S) / ED DIAGNOSES   Final diagnoses:  Seizure-like activity (HCC)  Alcohol  withdrawal seizure without complication (HCC)  Puncture wound of lip, initial encounter   Rx / DC Orders   ED Discharge Orders          Ordered    chlordiazePOXIDE  (LIBRIUM ) 25 MG capsule  Multiple Frequencies        06/08/24 1429           Note:  This  document was prepared using Dragon voice recognition software and may include unintentional dictation errors.   Nathaneil Feagans K, MD 06/08/24 [phone]

## 2024-06-08 NOTE — ED Triage Notes (Signed)
 [place] [facility] [place] [facility] witnessed seizure lasting 3-4 minutes. Pt did not hit head. Pt bit lip, has headache. Hx seizures, not on medication for seizures. A&O x4  Cbg 113 BP 143/99
